# Patient Record
Sex: Male | Born: 1955 | Race: White | Hispanic: No | Marital: Married | Smoking: Former smoker
Health system: Southern US, Community
[De-identification: ages and names within clinical notes are randomized; demographics above are authoritative.]

## PROBLEM LIST (undated history)

## (undated) HISTORY — PX: CHOLECYSTECTOMY: SHX55

---

## 2001-01-15 ENCOUNTER — Ambulatory Visit: Admit: 2001-01-15 | Disposition: A | Discharge status: Home / Self Care | Payer: Self-pay

## 2002-04-12 ENCOUNTER — Inpatient Hospital Stay
Admission: EM | Admit: 2002-04-12 | Disposition: A | Discharge status: Home / Self Care | Payer: Self-pay | Source: Emergency Department | Admitting: Internal Medicine

## 2002-05-01 DIAGNOSIS — E0500 Thyrotoxicosis with diffuse goiter without thyrotoxic crisis or storm: Secondary | ICD-10-CM

## 2002-05-01 HISTORY — DX: Thyrotoxicosis with diffuse goiter without thyrotoxic crisis or storm: E05.00

## 2003-05-02 DIAGNOSIS — G8929 Other chronic pain: Secondary | ICD-10-CM

## 2003-05-02 HISTORY — DX: Other chronic pain: G89.29

## 2015-05-02 DIAGNOSIS — H9190 Unspecified hearing loss, unspecified ear: Secondary | ICD-10-CM

## 2015-05-02 HISTORY — DX: Unspecified hearing loss, unspecified ear: H91.90

## 2018-05-02 ENCOUNTER — Ambulatory Visit (INDEPENDENT_AMBULATORY_CARE_PROVIDER_SITE_OTHER): Payer: TRICARE Prime—HMO | Admitting: Family Medicine

## 2018-05-02 ENCOUNTER — Encounter (INDEPENDENT_AMBULATORY_CARE_PROVIDER_SITE_OTHER): Payer: Self-pay | Admitting: Family Medicine

## 2018-05-02 VITALS — BP 127/72 | HR 72 | Temp 97.5°F | Resp 12 | Ht 69.0 in | Wt 240.0 lb

## 2018-05-02 DIAGNOSIS — M25561 Pain in right knee: Secondary | ICD-10-CM

## 2018-05-02 DIAGNOSIS — M25562 Pain in left knee: Secondary | ICD-10-CM

## 2018-05-02 DIAGNOSIS — M5136 Other intervertebral disc degeneration, lumbar region: Secondary | ICD-10-CM

## 2018-05-02 DIAGNOSIS — L80 Vitiligo: Secondary | ICD-10-CM

## 2018-05-02 DIAGNOSIS — G8929 Other chronic pain: Secondary | ICD-10-CM

## 2018-05-02 NOTE — Progress Notes (Signed)
Subjective:      Date: 05/02/2018 11:20 PM   Patient ID: Justin Peterson is a 63 y.o. male.    Chief Complaint:  Chief Complaint   Patient presents with    Back Pain     x 55yrs due to accident in college. 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 Pt states [facility] Ulyess Blossom    Vitiligo     Derm referral to [facility]    Immunizations     pneumonia vaccine       HPI:  HPI     Patient presents new to the practice. Medical history significant for:  1. Chronic history of Low back pain: L5 and S1 herniated and desiccated disks.   2. Bilateral knee pain-  right knee worse than the left.   Pain has been a result of injuries during [employer] hockey and [employer].     He is followed by pain management (every 4 weeks), a chiropractor and routinely has massages by [facility].  The back pain can be severe with pain described as a 3/10 at this time but can be a 10/10. He  uses Vicodin 2-3 times per day, Robaxin 1- 2 times per day and  Naprosyn (for swelling) as needed/0    3. He has vitiligo - around the mouth, followed by Dermatology. Currently on desonide - 2 times per days alternated with Elidel. He needs a referral to Dermatology.         No problems updated.      Problem List:  There is no problem list on file for this patient.      Current Medications:  Current Outpatient Medications   Medication Sig Dispense Refill    aspirin EC 81 MG EC tablet Take 81 mg by mouth daily      atorvastatin (LIPITOR) 40 MG tablet Take 40 mg by mouth daily  1    Cholecalciferol (VITAMIN D) 50 MCG (2000 UT) Cap Take 1 tablet by mouth daily      desonide (DESOWEN) 0.05 % cream APPLY TO THE AFFECTED AREA ON FACE TWICE DAILY  0    famotidine (PEPCID) 20 MG tablet Take 20 mg by mouth nightly as needed  1    HYDROcodone-acetaminophen (NORCO) 5-325 MG per tablet Take 3 tablets by mouth daily  0    levothyroxine (SYNTHROID, LEVOTHROID) 175 MCG  tablet Take 175 mcg by mouth daily  1    lidocaine (LIDODERM) 5 % PLACE 2 PATCHES TO KNEE 12 HOURS ON 12 HOURS OFF  0    loratadine (CLARITIN) 10 MG tablet Take 10 mg by mouth daily  1    methocarbamol (ROBAXIN) 500 MG tablet Take 1 tablet by mouth 2 (two) times daily  0    montelukast (SINGULAIR) 10 MG tablet Take 10 mg by mouth every evening  1    naproxen (NAPROSYN) 500 MG tablet Take 1 tablet by mouth 2 (two) times daily  1    omeprazole (PRILOSEC) 40 MG capsule Take 40 mg by mouth daily  1    pimecrolimus (ELIDEL) 1 % cream APPLY TO THE AFFECTED AREA ON THE FACE TWICE DAILY  2    Tadalafil 2.5 MG Tab Take 1 tablet by mouth daily  1    tamsulosin (FLOMAX) 0.4 MG Cap Take 0.4 mg by mouth daily  1     No current facility-administered medications for this visit.  Allergies:  Allergies   Allergen Reactions    Methimazole Swelling     tapazole    Pennsaid [Diclofenac Sodium]      Rash burning sensation/ topical       Past Medical History:  Past Medical History:   Diagnosis Date    Chronic pain 2005    Graves disease 2004    Hearing loss 2017       Past Surgical History:  History reviewed. No pertinent surgical history.    Family History:  Family History   Problem Relation Age of Onset    Stroke Mother        Social History:  Social History     Socioeconomic History    Marital status: Married     Spouse name: Not on file    Number of children: Not on file    Years of education: Not on file    Highest education level: Not on file   Occupational History    Not on file   Social Needs    Financial resource strain: Not on file    Food insecurity:     Worry: Not on file     Inability: Not on file    Transportation needs:     Medical: Not on file     Non-medical: Not on file   Tobacco Use    Smoking status: Former Smoker    Smokeless tobacco: Never Used    Tobacco comment: quit 2013   Substance and Sexual Activity    Alcohol use: Not Currently     Comment: sober 2002    Drug use: Never     Sexual activity: Yes     Partners: Female   Lifestyle    Physical activity:     Days per week: Not on file     Minutes per session: Not on file    Stress: Not on file   Relationships    Social connections:     Talks on phone: Not on file     Gets together: Not on file     Attends religious service: Not on file     Active member of club or organization: Not on file     Attends meetings of clubs or organizations: Not on file     Relationship status: Not on file    Intimate partner violence:     Fear of current or ex partner: Not on file     Emotionally abused: Not on file     Physically abused: Not on file     Forced sexual activity: Not on file   Other Topics Concern    Not on file   Social History Narrative    Not on file       The following sections were reviewed this encounter by the provider:   Tobacco   Allergies   Meds   Problems   Med Hx   Surg Hx   Fam Hx          ROS:  Review of Systems   Respiratory: Negative for shortness of breath.    Cardiovascular: Negative for chest pain and palpitations.   Musculoskeletal: Positive for arthralgias (knees bilaterally) and back pain.   Skin: Positive for color change (perioral).   Psychiatric/Behavioral: Positive for sleep disturbance (pain makes sleep challenging).        Objective:     Vitals:  BP 127/72 (BP Site: Left arm, Patient Position: Sitting, Cuff Size: Medium)    Pulse  72    Temp 97.5 F (36.4 C) (Oral)    Resp 12    Ht 1.753 m (5\' 9" )    Wt 108.9 kg (240 lb)    SpO2 97%    BMI 35.44 kg/m     Physical Exam:  Physical Exam  Vitals signs and nursing note reviewed.   Constitutional:       Appearance: Normal appearance.   Cardiovascular:      Rate and Rhythm: Normal rate and regular rhythm.      Pulses: Normal pulses.   Musculoskeletal:      Comments: Pain with terminal ROM at the L-S spine - notably with forward flexion- but also with lateral rotation     Skin:     General: Skin is warm and dry.      Comments: Discoloration not clearly obvious at this  time   Neurological:      Mental Status: He is alert and oriented to person, place, and time.      Comments: negative straight leg raise. Symmetrically depressed patella reflexes   Psychiatric:         Mood and Affect: Mood normal.         Behavior: Behavior normal.         Assessment/Plan:       1. DDD (degenerative disc disease), lumbar  - Ambulatory referral to Pain Clinic    2. Chronic pain of both knees  - Ambulatory referral to Pain Clinic     - Pain management per Pain Clinic- Dr. Ulyess Blossom  3. Vitiligo  - Ambulatory referral to Dermatology   - Management per Dermatology      No follow-ups on file.    Recommend Shingrix vaccine- available at local pharmacies  Hep C testing pending  Follow up for persistent or worsening symptoms.        Philipp Ovens, MD

## 2018-05-02 NOTE — Progress Notes (Signed)
Have you seen any specialists/other providers since your last visit with us?    No    Arm preference verified?   Yes    The patient is due for shingles vaccine

## 2018-06-04 ENCOUNTER — Encounter (INDEPENDENT_AMBULATORY_CARE_PROVIDER_SITE_OTHER): Payer: Self-pay | Admitting: Family Medicine

## 2018-07-08 ENCOUNTER — Encounter (INDEPENDENT_AMBULATORY_CARE_PROVIDER_SITE_OTHER): Payer: Self-pay | Admitting: Family Medicine

## 2018-07-09 ENCOUNTER — Encounter (INDEPENDENT_AMBULATORY_CARE_PROVIDER_SITE_OTHER): Payer: Self-pay | Admitting: Family

## 2018-07-09 ENCOUNTER — Ambulatory Visit (INDEPENDENT_AMBULATORY_CARE_PROVIDER_SITE_OTHER): Payer: TRICARE Prime—HMO | Admitting: Family

## 2018-07-09 VITALS — BP 116/78 | HR 92 | Temp 97.5°F | Resp 18 | Ht 69.0 in | Wt 230.0 lb

## 2018-07-09 DIAGNOSIS — J0100 Acute maxillary sinusitis, unspecified: Secondary | ICD-10-CM

## 2018-07-09 NOTE — Progress Notes (Signed)
[facility] [place]    PROGRESS NOTE      Patient: Justin Peterson   Date: 07/09/2018   MRN: [id number]     Past Medical History:   Diagnosis Date    Chronic pain 2005    Graves disease 2004    Hearing loss 2017     Social History     Socioeconomic History    Marital status: Married     Spouse name: Not on file    Number of children: Not on file    Years of education: Not on file    Highest education level: Not on file   Occupational History    Not on file   Social Needs    Financial resource strain: Not on file    Food insecurity:     Worry: Not on file     Inability: Not on file    Transportation needs:     Medical: Not on file     Non-medical: Not on file   Tobacco Use    Smoking status: Former Smoker    Smokeless tobacco: Never Used    Tobacco comment: quit 2013   Substance and Sexual Activity    Alcohol use: Not Currently     Comment: sober 2002    Drug use: Never    Sexual activity: Yes     Partners: Female   Lifestyle    Physical activity:     Days per week: Not on file     Minutes per session: Not on file    Stress: Not on file   Relationships    Social connections:     Talks on phone: Not on file     Gets together: Not on file     Attends religious service: Not on file     Active member of club or organization: Not on file     Attends meetings of clubs or organizations: Not on file     Relationship status: Not on file    Intimate partner violence:     Fear of current or ex partner: Not on file     Emotionally abused: Not on file     Physically abused: Not on file     Forced sexual activity: Not on file   Other Topics Concern    Not on file   Social History Narrative    Not on file     Family History   Problem Relation Age of Onset    Stroke Mother            ASSESSMENT/PLAN     Justin Peterson is a 63 y.o. male    Chief Complaint   Patient presents with    Sinus Problem        1. Acute non-recurrent maxillary sinusitis    Most likely viral or related to allergies. Viral  infections can last 7-10 days so would recommend monitoring symptoms closely and f/u if persist or worsening but no indication for ABX today or additional testing needed.    There are many things that you can do though to help relieve your symptoms. Supportive care includes: REST, increase fluids   to help loosen secretions, Vitamin C 500-1000mg  daily, humdifer/hot showers, salt water gargles if sore throat presents, Tylenol & Ibuprofen OTC for pain relief/fever. Continue to eat a nutrition diet to aid in recovery. Universal precautions reviewed with pt to prevent spread.   If symptoms worsen or do not improve this would warrant further evaluation  and follow-up would be recommended.           Risk & Benefits of any new medication(s) if started at this visit, were explained to the patient (and family) who verbalized understanding & they are in agreement to the treatment plan.     Patient (family) encouraged to contact me/clinical staff with any questions/concerns    MEDICATIONS     Current Outpatient Medications   Medication Sig Dispense Refill    aspirin EC 81 MG EC tablet Take 81 mg by mouth daily      atorvastatin (LIPITOR) 40 MG tablet Take 40 mg by mouth daily  1    desonide (DESOWEN) 0.05 % cream APPLY TO THE AFFECTED AREA ON FACE TWICE DAILY  0    famotidine (PEPCID) 20 MG tablet Take 20 mg by mouth nightly as needed  1    HYDROcodone-acetaminophen (NORCO) 5-325 MG per tablet Take 3 tablets by mouth daily  0    levothyroxine (SYNTHROID, LEVOTHROID) 175 MCG tablet Take 175 mcg by mouth daily  1    lidocaine (LIDODERM) 5 % PLACE 2 PATCHES TO KNEE 12 HOURS ON 12 HOURS OFF  0    loratadine (CLARITIN) 10 MG tablet Take 10 mg by mouth daily  1    methocarbamol (ROBAXIN) 500 MG tablet Take 1 tablet by mouth as needed     0    montelukast (SINGULAIR) 10 MG tablet Take 10 mg by mouth every evening  1    naproxen (NAPROSYN) 500 MG tablet Take 1 tablet by mouth as needed     1    omeprazole (PRILOSEC) 40 MG  capsule Take 40 mg by mouth daily  1    pimecrolimus (ELIDEL) 1 % cream APPLY TO THE AFFECTED AREA ON THE FACE TWICE DAILY  2    sildenafil (VIAGRA) 100 MG tablet       tamsulosin (FLOMAX) 0.4 MG Cap Take 0.4 mg by mouth daily  1     No current facility-administered medications for this visit.        Allergies   Allergen Reactions    Methimazole Swelling     tapazole    Pennsaid [Diclofenac Sodium]      Rash burning sensation/ topical       SUBJECTIVE     Chief Complaint   Patient presents with    Sinus Problem      63 year old male with sinus problems. This started last Friday morning with congestion. Had just came back from a cruise on Saturday. Symptoms: stuffy nose, watery eyes, little ear ache in left ear, cough and sore throat. Not worried about strep and no fevers of flu-like symptoms    Sinusitis   This is a new problem. The current episode started in the past 7 days (4 days). The problem is unchanged. There has been no fever. He is experiencing no pain (pressure only). Associated symptoms include congestion, coughing and sinus pressure. Pertinent negatives include no chills, ear pain, headaches, hoarse voice, shortness of breath, sneezing, sore throat or swollen glands. Treatments tried: zrytec- zicam. The treatment provided no relief.       ROS     Review of Systems   Constitutional: Negative for activity change, appetite change, chills, fatigue and fever.   HENT: Positive for congestion and sinus pressure. Negative for ear pain, hoarse voice, postnasal drip, rhinorrhea, sneezing, sore throat and voice change.    Eyes: Negative for discharge.   Respiratory: Positive for cough.  Negative for shortness of breath and wheezing.    Cardiovascular: Negative for chest pain and palpitations.   Gastrointestinal: Negative for abdominal pain, constipation, diarrhea, nausea and vomiting.   Musculoskeletal: Negative for myalgias.   Skin: Negative for rash.   Neurological: Negative for dizziness and headaches.        The following portions of the patient's history were reviewed and updated as appropriate: Allergies, Current Medications, Past Family History, Past Medical history, Past social history, Past surgical history, and Problem List.      PHYSICAL EXAM       Vitals:    07/09/18 1316   BP: 116/78   BP Site: Left arm   Patient Position: Sitting   Cuff Size: Medium   Pulse: 92   Resp: 18   Temp: 97.5 F (36.4 C)   TempSrc: Oral   SpO2: 98%   Weight: 104.3 kg (230 lb)   Height: 1.753 m (5\' 9" )       Physical Exam   Nursing note and vitals reviewed.  Constitutional: He is oriented to person, place, and time. He appears well-developed and well-nourished. No distress.   HENT:   Head: Normocephalic.   Right Ear: Tympanic membrane and external ear normal.   Left Ear: Tympanic membrane and external ear normal.   Nose: Nose normal. No mucosal edema. Right sinus exhibits no maxillary sinus tenderness and no frontal sinus tenderness. Left sinus exhibits no maxillary sinus tenderness and no frontal sinus tenderness.   Mouth/Throat: Uvula is midline, oropharynx is clear and moist and mucous membranes are normal. No oropharyngeal exudate, posterior oropharyngeal edema or posterior oropharyngeal erythema.   Eyes: Conjunctivae are normal.   Cardiovascular: Normal rate, regular rhythm and normal heart sounds. Exam reveals no gallop and no friction rub.   No murmur heard.  Pulmonary/Chest: Effort normal and breath sounds normal. No respiratory distress. He has no wheezes. He has no rales.   Lymphadenopathy:     He has no cervical adenopathy.   Neurological: He is alert and oriented to person, place, and time.   Skin: Skin is warm and dry. No rash noted.   Psychiatric: He has a normal mood and affect.       No results found for this or any previous visit.    Signed,  Balinda Quails, FNP-c  07/09/2018

## 2018-07-09 NOTE — Patient Instructions (Signed)
Sinusitis (No Antibiotics)    The sinuses are air-filled spaces within the bones of the face. They connect to the inside of the nose.Sinusitisis an inflammation of the tissue that lines the sinuses. Sinusitiscan occur during a cold.It can also happen due to allergies to pollens and other particles in the air.It can cause symptoms such as sinus congestion,headache, sore throat, facial swelling,and a feeling of fullness. It may also cause a low-grade fever.Your sinusitis does not include an infection with bacteria. Because of this, antibiotics are not used to treat this problem.  Home care   Drink plenty of water, hot tea, and other liquids. This may help thin nasal mucus. It also may help your sinuses drain fluids.   Heat may help soothe painful areas of your face. Use a towel soaked in hot water. Or, stand in the shower and direct the warm spray onto your face. Using a vaporizer along with a menthol rub at night may also help soothe symptoms.   Anexpectorantwith guaifenesin may help thin nasal mucus and help your sinuses drain fluids.   You can use an over-the-counterdecongestant,unless a similar medicine was prescribed to you. Nasal sprays work the fastest. Use one that contains phenylephrine or oxymetazoline. First blow your nose gently. Then use the spray. Do not use these medicines more often than directed on the label. If you do, your symptoms may get worse. You may also take pills that contain pseudoephedrine. Don't use products that combine multiple medicines. This is because side effects may be increased. Read all medicine labels. You can also ask the pharmacist for help. (People with high blood pressure should not use decongestants. They can raise blood pressure.)   Over-the-counterantihistaminesmay help if allergies contributed to your sinusitis.    Use acetaminophen or ibuprofen to control pain, unless another pain medicine was prescribed to you. If you have chronic liver or kidney  disease or ever had a stomach ulcer, talk with your healthcare provider before using these medicines. (Aspirin should never be taken by anyone under age 18 who is ill with a fever. It may cause severe liver damage.)   Use nasal rinses or irrigation as instructed by your healthcare provider.   Don't smoke. This can make symptoms worse.    Follow-up care  Follow up with your healthcare provider or our staff if you are not better in 1 week.  When to seek medical advice  Call your healthcare provider if any of these occur:   Green or yellow fluid draining from your nose or into your throat   Facial pain or headache that gets worse   Stiff neck   Unusual drowsiness or confusion   Swelling of your forehead or eyelids   Vision problems, such as blurred or double vision   Fever of100.4F (38C)or higher, or as directed by your healthcare provider   Seizure   Breathing problems   Symptoms that don't go away in 10 days  [employer] last reviewed this educational content on 03/01/2016   2000-2020 [employer] [address], [place] [address]. All rights reserved. This information is not intended as a substitute for professional medical care. Always follow your healthcare professional's instructions.

## 2018-07-09 NOTE — Progress Notes (Signed)
Have you seen any specialists/other providers since your last visit with us?    Yes    Arm preference verified?   Yes    The patient is due for shingles vaccine and Hep C Screening

## 2018-08-16 ENCOUNTER — Encounter (INDEPENDENT_AMBULATORY_CARE_PROVIDER_SITE_OTHER): Payer: Self-pay | Admitting: Family Medicine

## 2018-09-02 ENCOUNTER — Encounter (INDEPENDENT_AMBULATORY_CARE_PROVIDER_SITE_OTHER): Payer: Self-pay | Admitting: Family

## 2018-09-02 ENCOUNTER — Telehealth: Payer: TRICARE Prime—HMO | Admitting: Family

## 2018-09-02 DIAGNOSIS — R3911 Hesitancy of micturition: Secondary | ICD-10-CM | POA: Insufficient documentation

## 2018-09-02 DIAGNOSIS — Z7689 Persons encountering health services in other specified circumstances: Secondary | ICD-10-CM

## 2018-09-02 DIAGNOSIS — G8929 Other chronic pain: Secondary | ICD-10-CM | POA: Insufficient documentation

## 2018-09-02 DIAGNOSIS — K2270 Barrett's esophagus without dysplasia: Secondary | ICD-10-CM | POA: Insufficient documentation

## 2018-09-02 DIAGNOSIS — Z9109 Other allergy status, other than to drugs and biological substances: Secondary | ICD-10-CM

## 2018-09-02 DIAGNOSIS — N401 Enlarged prostate with lower urinary tract symptoms: Secondary | ICD-10-CM

## 2018-09-02 DIAGNOSIS — K219 Gastro-esophageal reflux disease without esophagitis: Secondary | ICD-10-CM

## 2018-09-02 DIAGNOSIS — N529 Male erectile dysfunction, unspecified: Secondary | ICD-10-CM

## 2018-09-02 DIAGNOSIS — L80 Vitiligo: Secondary | ICD-10-CM

## 2018-09-02 DIAGNOSIS — N528 Other male erectile dysfunction: Secondary | ICD-10-CM | POA: Insufficient documentation

## 2018-09-02 DIAGNOSIS — E0500 Thyrotoxicosis with diffuse goiter without thyrotoxic crisis or storm: Secondary | ICD-10-CM

## 2018-09-02 DIAGNOSIS — E782 Mixed hyperlipidemia: Secondary | ICD-10-CM

## 2018-09-02 MED ORDER — SILDENAFIL CITRATE 100 MG PO TABS
100.0000 mg | ORAL_TABLET | ORAL | 2 refills | Status: DC | PRN
Start: 2018-09-02 — End: 2019-02-06

## 2018-09-02 MED ORDER — OMEPRAZOLE 40 MG PO CPDR
40.00 mg | DELAYED_RELEASE_CAPSULE | Freq: Every day | ORAL | 1 refills | Status: DC
Start: 2018-09-02 — End: 2019-02-20

## 2018-09-02 MED ORDER — MONTELUKAST SODIUM 10 MG PO TABS
10.0000 mg | ORAL_TABLET | Freq: Every evening | ORAL | 3 refills | Status: DC
Start: 2018-09-02 — End: 2019-08-07

## 2018-09-02 MED ORDER — ATORVASTATIN CALCIUM 40 MG PO TABS
40.0000 mg | ORAL_TABLET | Freq: Every day | ORAL | 1 refills | Status: DC
Start: 2018-09-02 — End: 2019-01-20

## 2018-09-02 MED ORDER — FAMOTIDINE 20 MG PO TABS
20.0000 mg | ORAL_TABLET | Freq: Every evening | ORAL | 1 refills | Status: DC | PRN
Start: 2018-09-02 — End: 2019-08-07

## 2018-09-02 MED ORDER — LEVOTHYROXINE SODIUM 175 MCG PO TABS
175.0000 ug | ORAL_TABLET | Freq: Every day | ORAL | 1 refills | Status: DC
Start: 2018-09-02 — End: 2019-01-20

## 2018-09-02 MED ORDER — TAMSULOSIN HCL 0.4 MG PO CAPS
0.40 mg | ORAL_CAPSULE | Freq: Every day | ORAL | 3 refills | Status: DC
Start: 2018-09-02 — End: 2019-08-07

## 2018-09-02 MED ORDER — LORATADINE 10 MG PO TABS
10.0000 mg | ORAL_TABLET | Freq: Every day | ORAL | 3 refills | Status: DC
Start: 2018-09-02 — End: 2019-08-07

## 2018-09-02 NOTE — Progress Notes (Signed)
[facility]    PROGRESS NOTE      Patient: Justin Peterson   Date: 09/02/2018   MRN: [id number]     Past Medical History:   Diagnosis Date    Chronic pain 2005    Graves disease 2004    Hearing loss 2017     Social History     Socioeconomic History    Marital status: Married     Spouse name: Not on file    Number of children: Not on file    Years of education: Not on file    Highest education level: Not on file   Occupational History    Not on file   Social Needs    Financial resource strain: Not on file    Food insecurity:     Worry: Not on file     Inability: Not on file    Transportation needs:     Medical: Not on file     Non-medical: Not on file   Tobacco Use    Smoking status: Former Smoker    Smokeless tobacco: Never Used    Tobacco comment: quit 2013   Substance and Sexual Activity    Alcohol use: Not Currently     Comment: sober 2002    Drug use: Never    Sexual activity: Yes     Partners: Female   Lifestyle    Physical activity:     Days per week: Not on file     Minutes per session: Not on file    Stress: Not on file   Relationships    Social connections:     Talks on phone: Not on file     Gets together: Not on file     Attends religious service: Not on file     Active member of club or organization: Not on file     Attends meetings of clubs or organizations: Not on file     Relationship status: Not on file    Intimate partner violence:     Fear of current or ex partner: Not on file     Emotionally abused: Not on file     Physically abused: Not on file     Forced sexual activity: Not on file   Other Topics Concern    Not on file   Social History Narrative    Not on file     Family History   Problem Relation Age of Onset    Stroke Mother            ASSESSMENT/PLAN     Justin Peterson is a 63 y.o. male    Chief Complaint   Patient presents with    Hyperlipidemia     Follow up and med refill. Doing fine on all medications. Needs them all refilled.     Hypothyroidism      Follow up. Doing well on medication. Needs a refill.     Medication Refill     Needs refills on all meds.         1. Mixed hyperlipidemia  - atorvastatin (LIPITOR) 40 MG tablet; Take 1 tablet (40 mg total) by mouth daily  Dispense: 90 tablet; Refill: 1    Chronic, stable; continue current meds  Declined labs due to covid pandemic risk- last labs with previous PCP 6 months ago and will get records.    2. Graves' disease  - levothyroxine (SYNTHROID, LEVOTHROID) 175 MCG tablet; Take 1 tablet (175 mcg total)  by mouth daily  Dispense: 90 tablet; Refill: 1    Chronic, stable; continue current meds  Declined labs due to covid pandemic risk- last labs with previous PCP 6 months ago and will get records.    3. Other chronic pain    Continue to follow with pain management    4. Gastroesophageal reflux disease without esophagitis  - Ambulatory referral to Gastroenterology  - famotidine (PEPCID) 20 MG tablet; Take 1 tablet (20 mg total) by mouth nightly as needed for Heartburn  Dispense: 90 tablet; Refill: 1  - omeprazole (PRILOSEC) 40 MG capsule; Take 1 capsule (40 mg total) by mouth daily  Dispense: 90 capsule; Refill: 1    Chronic, likely naproxen use contributing discuss with pain management.   F/u with GI due to chronic use    5. Vitiligo    Continue to follow with dermatologist    6. Environmental allergies  - montelukast (SINGULAIR) 10 MG tablet; Take 1 tablet (10 mg total) by mouth every evening  Dispense: 90 tablet; Refill: 3  - loratadine (CLARITIN) 10 MG tablet; Take 1 tablet (10 mg total) by mouth daily  Dispense: 90 tablet; Refill: 3    Chronic, stable; continue current meds    7. Benign prostatic hyperplasia with urinary hesitancy  - tamsulosin (FLOMAX) 0.4 MG Cap; Take 1 capsule (0.4 mg total) by mouth daily  Dispense: 90 capsule; Refill: 3    Chronic, stable; continue current meds  Doe not feel he needs urologist  PSA due in 6 months    8. Erectile dysfunction, unspecified erectile dysfunction type  -  sildenafil (VIAGRA) 100 MG tablet; Take 1 tablet (100 mg total) by mouth as needed for Erectile Dysfunction  Dispense: 18 tablet; Refill: 2    9. Encounter to establish care    Schedule physical 6 months         Risk & Benefits of the new medication(s) were explained to the patient (and family) who verbalized understanding & agreed to the treatment plan. Patient (family) encouraged to contact me/clinical staff with any questions/concerns      MEDICATIONS     Current Outpatient Medications   Medication Sig Dispense Refill    aspirin EC 81 MG EC tablet Take 81 mg by mouth daily      atorvastatin (LIPITOR) 40 MG tablet Take 1 tablet (40 mg total) by mouth daily 90 tablet 1    desonide (DESOWEN) 0.05 % cream APPLY TO THE AFFECTED AREA ON FACE TWICE DAILY  0    famotidine (PEPCID) 20 MG tablet Take 1 tablet (20 mg total) by mouth nightly as needed for Heartburn 90 tablet 1    HYDROcodone-acetaminophen (NORCO) 5-325 MG per tablet Take 3 tablets by mouth daily  0    levothyroxine (SYNTHROID, LEVOTHROID) 175 MCG tablet Take 1 tablet (175 mcg total) by mouth daily 90 tablet 1    lidocaine (LIDODERM) 5 % as needed     0    loratadine (CLARITIN) 10 MG tablet Take 1 tablet (10 mg total) by mouth daily 90 tablet 3    methocarbamol (ROBAXIN) 500 MG tablet Take 1 tablet by mouth as needed     0    montelukast (SINGULAIR) 10 MG tablet Take 1 tablet (10 mg total) by mouth every evening 90 tablet 3    naproxen (NAPROSYN) 500 MG tablet Take 1 tablet by mouth as needed     1    omeprazole (PRILOSEC) 40 MG capsule Take 1 capsule (40 mg  total) by mouth daily 90 capsule 1    pimecrolimus (ELIDEL) 1 % cream APPLY TO THE AFFECTED AREA ON THE FACE TWICE DAILY  2    sildenafil (VIAGRA) 100 MG tablet Take 1 tablet (100 mg total) by mouth as needed for Erectile Dysfunction 18 tablet 2    tamsulosin (FLOMAX) 0.4 MG Cap Take 1 capsule (0.4 mg total) by mouth daily 90 capsule 3     No current facility-administered medications for  this visit.        Allergies   Allergen Reactions    Methimazole Swelling     tapazole    Pennsaid [Diclofenac Sodium]      Rash burning sensation/ topical       SUBJECTIVE     Chief Complaint   Patient presents with    Hyperlipidemia     Follow up and med refill. Doing fine on all medications. Needs them all refilled.     Hypothyroidism     Follow up. Doing well on medication. Needs a refill.     Medication Refill     Needs refills on all meds.         HPI    Verbal consent has been obtained from the patient to conduct a video and telephone visit to minimize exposure to COVID-48: yes    63 year old male present via video to establish care and all conditions new to me and needs med refill.   .  Problem   Graves' Disease    Chronic and dx 2004/2005- US done and showed graves. Symptoms at dx: fatigue, increase hunger, itching all over, and weight loss.  Did follow with endocrinologist at dx- had radioactive iodine. Has been on synthroid and stable since and no need for dose changes in awhile. No current symptoms. Last labs 6 months ago and normal per pt.     Mixed Hyperlipidemia    Chronic and has been controlled per pt on lipitor 40 mg and no SE to medication. Pt reports last labs was 6 months ago and cholesterol normal as well as liver function.     Other Chronic Pain    [facility] and on Norco three times a day and robaxin. Also taking naproxen daily and sometimes twice a day and will discuss with spine specialist.      Gastroesophageal Reflux Disease Without Esophagitis    Chronic and on pepcid at night and Prilosec in morning. Has not seen GI specialist.      Vitiligo    Follows with dermatologist and stable on meds     Benign Prostatic Hyperplasia With Urinary Hesitancy    PSA last year was normal and likely BPH. Symptoms: hesitation at times. Has never followed urologist since starting flomax for over 5 year. Also takes chronic hydrocodone.      Other Male Erectile  Dysfunction    Has been on viagra and cialsis both and tolerated well and insurance covers viagra.         ROS     General/Constitutional:   Denies Chills. Denies Fatigue. Denies Fever.   Ophthalmologic:   Denies Blurred vision.   ENT:   Denies Nasal Discharge. Denies Sinus pain. Denies Sore throat.   Respiratory:   Denies Cough. Denies Shortness of breath. Denies Wheezing.   Cardiovascular:   Denies Chest pain. Denies Chest pain with exertion. Denies Palpitations. Denies  Swelling in hands/feet.   Gastrointestinal:   Denies Abdominal pain. Denies Constipation. Denies  Diarrhea. Denies Nausea. Denies  Vomiting.   Skin:   Denies Rash.   Neurologic:   Denies Dizziness. Denies Headache. Denies Tingling/Numbness.       The following portions of the patient's history were reviewed and updated as appropriate: Allergies, Current Medications, Past Family History, Past Medical history, Past social history, Past surgical history, and Problem List.      PHYSICAL EXAM       There were no vitals filed for this visit.    General Examination:   GENERAL APPEARANCE: alert, in no acute distress, well developed, well nourished,  oriented to time, place, and person.   LUNGS: normal effort / no distress  EXTREMITIES: no edema bilaterally.   NEUROLOGIC: alert and oriented.         No results found for this or any previous visit.    Signed,  Balinda Quails, FNP-c  09/02/2018

## 2018-09-02 NOTE — Progress Notes (Signed)
Have you seen any specialists/other providers since your last visit with us?    Yes    Arm preference verified?   No    The patient is due for shingles vaccine and Hep C Screening

## 2018-11-13 ENCOUNTER — Encounter (INDEPENDENT_AMBULATORY_CARE_PROVIDER_SITE_OTHER): Payer: Self-pay | Admitting: Family

## 2018-12-04 ENCOUNTER — Encounter (INDEPENDENT_AMBULATORY_CARE_PROVIDER_SITE_OTHER): Payer: Self-pay | Admitting: Family

## 2018-12-05 ENCOUNTER — Other Ambulatory Visit (INDEPENDENT_AMBULATORY_CARE_PROVIDER_SITE_OTHER): Payer: Self-pay | Admitting: Family

## 2018-12-05 DIAGNOSIS — H538 Other visual disturbances: Secondary | ICD-10-CM

## 2018-12-05 NOTE — Progress Notes (Signed)
Ref for Opth. Queued up

## 2018-12-06 ENCOUNTER — Encounter (INDEPENDENT_AMBULATORY_CARE_PROVIDER_SITE_OTHER): Payer: Self-pay | Admitting: Family

## 2018-12-06 ENCOUNTER — Encounter (INDEPENDENT_AMBULATORY_CARE_PROVIDER_SITE_OTHER): Payer: Self-pay

## 2018-12-31 ENCOUNTER — Encounter (INDEPENDENT_AMBULATORY_CARE_PROVIDER_SITE_OTHER): Payer: Self-pay | Admitting: Family

## 2019-01-20 ENCOUNTER — Other Ambulatory Visit (INDEPENDENT_AMBULATORY_CARE_PROVIDER_SITE_OTHER): Payer: Self-pay | Admitting: Family

## 2019-01-20 DIAGNOSIS — E0500 Thyrotoxicosis with diffuse goiter without thyrotoxic crisis or storm: Secondary | ICD-10-CM

## 2019-01-20 DIAGNOSIS — E782 Mixed hyperlipidemia: Secondary | ICD-10-CM

## 2019-01-20 NOTE — Telephone Encounter (Signed)
Will refill for 90 day supply but recommend appt prior to next refills so we can get labs up to date and f/u- can you help him schedule an appt

## 2019-01-21 NOTE — Telephone Encounter (Signed)
Has an appointment on 02/06/2019

## 2019-01-24 ENCOUNTER — Encounter (INDEPENDENT_AMBULATORY_CARE_PROVIDER_SITE_OTHER): Payer: Self-pay | Admitting: Family

## 2019-02-06 ENCOUNTER — Ambulatory Visit (INDEPENDENT_AMBULATORY_CARE_PROVIDER_SITE_OTHER): Payer: TRICARE Prime—HMO | Admitting: Family

## 2019-02-06 ENCOUNTER — Encounter (INDEPENDENT_AMBULATORY_CARE_PROVIDER_SITE_OTHER): Payer: Self-pay | Admitting: Family

## 2019-02-06 VITALS — BP 106/67 | HR 76 | Temp 98.0°F | Resp 18 | Ht 69.0 in | Wt 240.0 lb

## 2019-02-06 DIAGNOSIS — N529 Male erectile dysfunction, unspecified: Secondary | ICD-10-CM

## 2019-02-06 DIAGNOSIS — E0500 Thyrotoxicosis with diffuse goiter without thyrotoxic crisis or storm: Secondary | ICD-10-CM

## 2019-02-06 DIAGNOSIS — G8929 Other chronic pain: Secondary | ICD-10-CM

## 2019-02-06 DIAGNOSIS — Z23 Encounter for immunization: Secondary | ICD-10-CM

## 2019-02-06 DIAGNOSIS — E669 Obesity, unspecified: Secondary | ICD-10-CM

## 2019-02-06 DIAGNOSIS — R3911 Hesitancy of micturition: Secondary | ICD-10-CM

## 2019-02-06 DIAGNOSIS — Z0000 Encounter for general adult medical examination without abnormal findings: Secondary | ICD-10-CM

## 2019-02-06 DIAGNOSIS — K219 Gastro-esophageal reflux disease without esophagitis: Secondary | ICD-10-CM

## 2019-02-06 DIAGNOSIS — Z1283 Encounter for screening for malignant neoplasm of skin: Secondary | ICD-10-CM

## 2019-02-06 DIAGNOSIS — M545 Low back pain: Secondary | ICD-10-CM

## 2019-02-06 DIAGNOSIS — E782 Mixed hyperlipidemia: Secondary | ICD-10-CM

## 2019-02-06 DIAGNOSIS — N401 Enlarged prostate with lower urinary tract symptoms: Secondary | ICD-10-CM

## 2019-02-06 DIAGNOSIS — Z125 Encounter for screening for malignant neoplasm of prostate: Secondary | ICD-10-CM

## 2019-02-06 DIAGNOSIS — Z1159 Encounter for screening for other viral diseases: Secondary | ICD-10-CM

## 2019-02-06 DIAGNOSIS — L80 Vitiligo: Secondary | ICD-10-CM

## 2019-02-06 LAB — COMPREHENSIVE METABOLIC PANEL
ALT: 27 U/L (ref 0–55)
AST (SGOT): 23 U/L (ref 5–34)
Albumin/Globulin Ratio: 1.9 (ref 0.9–2.2)
Albumin: 4.8 g/dL (ref 3.5–5.0)
Alkaline Phosphatase: 94 U/L (ref 38–106)
Anion Gap: 12 (ref 5.0–15.0)
BUN: 12 mg/dL (ref 9.0–28.0)
Bilirubin, Total: 0.6 mg/dL (ref 0.2–1.2)
CO2: 24 mEq/L (ref 21–29)
Calcium: 9.9 mg/dL (ref 8.5–10.5)
Chloride: 106 mEq/L (ref 100–111)
Creatinine: 0.9 mg/dL (ref 0.5–1.5)
Globulin: 2.5 g/dL (ref 2.0–3.7)
Glucose: 98 mg/dL (ref 70–100)
Potassium: 4.2 mEq/L (ref 3.5–5.1)
Protein, Total: 7.3 g/dL (ref 6.0–8.3)
Sodium: 142 mEq/L (ref 136–145)

## 2019-02-06 LAB — CBC AND DIFFERENTIAL
Absolute NRBC: 0 10*3/uL (ref 0.00–0.00)
Basophils Absolute Automated: 0.07 10*3/uL (ref 0.00–0.08)
Basophils Automated: 1.1 %
Eosinophils Absolute Automated: 0.36 10*3/uL (ref 0.00–0.44)
Eosinophils Automated: 5.9 %
Hematocrit: 42.4 % (ref 37.6–49.6)
Hgb: 14.3 g/dL (ref 12.5–17.1)
Immature Granulocytes Absolute: 0.02 10*3/uL (ref 0.00–0.07)
Immature Granulocytes: 0.3 %
Lymphocytes Absolute Automated: 1.2 10*3/uL (ref 0.42–3.22)
Lymphocytes Automated: 19.7 %
MCH: 30.6 pg (ref 25.1–33.5)
MCHC: 33.7 g/dL (ref 31.5–35.8)
MCV: 90.8 fL (ref 78.0–96.0)
MPV: 9.6 fL (ref 8.9–12.5)
Monocytes Absolute Automated: 0.51 10*3/uL (ref 0.21–0.85)
Monocytes: 8.4 %
Neutrophils Absolute: 3.94 10*3/uL (ref 1.10–6.33)
Neutrophils: 64.6 %
Nucleated RBC: 0 /100 WBC (ref 0.0–0.0)
Platelets: 281 10*3/uL (ref 142–346)
RBC: 4.67 10*6/uL (ref 4.20–5.90)
RDW: 13 % (ref 11–15)
WBC: 6.1 10*3/uL (ref 3.10–9.50)

## 2019-02-06 LAB — LIPID PANEL
Cholesterol / HDL Ratio: 3.2
Cholesterol: 132 mg/dL (ref 0–199)
HDL: 41 mg/dL (ref 40–9999)
LDL Calculated: 77 mg/dL (ref 0–99)
Triglycerides: 71 mg/dL (ref 34–149)
VLDL Calculated: 14 mg/dL (ref 10–40)

## 2019-02-06 LAB — TSH: TSH: 0.04 u[IU]/mL — ABNORMAL LOW (ref 0.35–4.94)

## 2019-02-06 LAB — HEMOLYSIS INDEX: Hemolysis Index: 43 — ABNORMAL HIGH (ref 0–18)

## 2019-02-06 LAB — URINALYSIS
Bilirubin, UA: NEGATIVE
Blood, UA: NEGATIVE
Glucose, UA: NEGATIVE
Ketones UA: NEGATIVE
Leukocyte Esterase, UA: NEGATIVE
Nitrite, UA: NEGATIVE
Protein, UR: NEGATIVE
Specific Gravity UA: 1.007 (ref 1.001–1.035)
Urine pH: 6.5 (ref 5.0–8.0)
Urobilinogen, UA: 0.2 (ref 0.2–2.0)

## 2019-02-06 LAB — VITAMIN D,25 OH,TOTAL: Vitamin D, 25 OH, Total: 56 ng/mL (ref 30–100)

## 2019-02-06 LAB — HEMOGLOBIN A1C
Average Estimated Glucose: 108.3 mg/dL
Hemoglobin A1C: 5.4 % (ref 4.6–5.9)

## 2019-02-06 LAB — HEPATITIS C ANTIBODY: Hepatitis C, AB: NONREACTIVE

## 2019-02-06 LAB — GFR: EGFR: >60

## 2019-02-06 LAB — PSA: Prostate Specific Antigen, Total: 1.065 ng/mL (ref 0.000–4.000)

## 2019-02-06 LAB — T4, FREE: T4 Free: 1.5 ng/dL — ABNORMAL HIGH (ref 0.70–1.48)

## 2019-02-06 MED ORDER — SILDENAFIL CITRATE 100 MG PO TABS
100.0000 mg | ORAL_TABLET | ORAL | 2 refills | Status: DC | PRN
Start: 2019-02-06 — End: 2019-08-07

## 2019-02-06 MED ORDER — ATORVASTATIN CALCIUM 40 MG PO TABS
40.0000 mg | ORAL_TABLET | Freq: Every day | ORAL | 1 refills | Status: DC
Start: 2019-02-06 — End: 2019-08-07

## 2019-02-06 MED ORDER — LEVOTHYROXINE SODIUM 175 MCG PO TABS
175.0000 ug | ORAL_TABLET | Freq: Every day | ORAL | 1 refills | Status: DC
Start: 2019-02-06 — End: 2019-05-21

## 2019-02-06 NOTE — Progress Notes (Signed)
[facility]    PROGRESS NOTE      Patient: Justin Peterson   Date: 02/06/2019   MRN: [id number]     Past Medical History:   Diagnosis Date    Chronic pain 2005    Graves disease 2004    Hearing loss 2017     Social History     Socioeconomic History    Marital status: Married     Spouse name: Not on file    Number of children: Not on file    Years of education: Not on file    Highest education level: Not on file   Occupational History    Not on file   Social Needs    Financial resource strain: Not on file    Food insecurity     Worry: Not on file     Inability: Not on file    Transportation needs     Medical: Not on file     Non-medical: Not on file   Tobacco Use    Smoking status: Former Smoker    Smokeless tobacco: Never Used    Tobacco comment: quit 2013   Substance and Sexual Activity    Alcohol use: Not Currently     Comment: sober 2002    Drug use: Never    Sexual activity: Yes     Partners: Female   Lifestyle    Physical activity     Days per week: Not on file     Minutes per session: Not on file    Stress: Not on file   Relationships    Social connections     Talks on phone: Not on file     Gets together: Not on file     Attends religious service: Not on file     Active member of club or organization: Not on file     Attends meetings of clubs or organizations: Not on file     Relationship status: Not on file    Intimate partner violence     Fear of current or ex partner: Not on file     Emotionally abused: Not on file     Physically abused: Not on file     Forced sexual activity: Not on file   Other Topics Concern    Not on file   Social History Narrative    Not on file     Family History   Problem Relation Age of Onset    Stroke Mother            ASSESSMENT/PLAN     Kelden Lavallee is a 63 y.o. male    Chief Complaint   Patient presents with    Annual Exam     He is fasting. Needs to renew pain clinic, dermatologist referrals.         1. Annual physical exam  - CBC and  differential  - Comprehensive metabolic panel  - TSH  - Lipid panel  - Urinalysis  - Vitamin D,25 OH, Total    Health promotion and disease prevention/screening recommendations discussed.    Exercise: Optimize aerobic exercise per ACC guidelines of 150 min per week.     Healthy eating: Aim for (8) 8 oz glasses  of water per day. Dietary fiber 25 grams per day. Optimize weight by eating a well balanced diet and encouraged daily calcium and vitamin D. Heart healthy diet encouraged, such as limiting saturated fat and trans fat and replacing these fats  with the poly and mono unsaturated fats. Plant sterols, fish oil, fiber, and sodium reduction is ideal.     Sleep hygiene: Recommendations for optimizing sleep hygiene includes: counseled on sleep hygiene (getting 6 hrs sleep minimum) 1) regular sleep times 2) Avoid caffeine 4-6 hours prior to going to bed 3) Avoid alcohol for at least 4-6 hours before going to bed 4) Engage in regular exercise 5) Keep daytime routine activities the same as planned 6) Stop screen time/viewing 1 hours prior to bedtime and 7) eat a well-balance diet, preferrably eat dinner no later than 2-3 hours prior to going to sleep.Reviewed labs with patient. Patient agreeable to plan.     Safety: seat belts, no texting and driving, no drinking and driving, no riding with people who have been drinking, moderation (calories, caffeine, alcohol etc), domestic violence awareness, recommend annual flu shots.    2. Need for influenza vaccination  - Flu vacc quad recombinant pres free 18 yrs& up    3. Mixed hyperlipidemia  - Lipid panel  - atorvastatin (LIPITOR) 40 MG tablet; Take 1 tablet (40 mg total) by mouth daily  Dispense: 90 tablet; Refill: 1    4. Graves' disease  - TSH  - T3, free  - T4, free  - levothyroxine (SYNTHROID) 175 MCG tablet; Take 1 tablet (175 mcg total) by mouth daily  Dispense: 90 tablet; Refill: 1    5. Gastroesophageal reflux disease without esophagitis    6. Benign prostatic  hyperplasia with urinary hesitancy  - PSA    7. Screening for prostate cancer  - PSA    8. Erectile dysfunction, unspecified erectile dysfunction type  - sildenafil (VIAGRA) 100 MG tablet; Take 1 tablet (100 mg total) by mouth as needed for Erectile Dysfunction  Dispense: 18 tablet; Refill: 2    9. Obesity (BMI 35.0-39.9 without comorbidity)  - Hemoglobin A1C    10. Chronic bilateral low back pain, unspecified whether sciatica present  - Ambulatory referral to Pain Clinic    11. Skin cancer screening  - Ambulatory referral to Dermatology    12. Vitiligo  - Ambulatory referral to Dermatology    13. Need for tetanus booster  - Td vaccine greater than or equal to 7yo IM    14. Need for shingles vaccine  - Zoster Vaccine Recomb,Adjuvanted (IM)    15. Encounter for hepatitis C screening test for low risk patient  - Hepatitis C (HCV) antibody, Total         Health maintenance:  -Fasting blood work: today, reviewed planned labs and pt agrees to them.   -Immunizations: flu, shingles, td today  -STD screen: declined need as no concerns  -Colonoscopy: due at 75  -Vision: UTD  -Dental: UTD      Risk & Benefits of the new medication(s) were explained to the patient (and family) who verbalized understanding & agreed to the treatment plan. Patient (family) encouraged to contact me/clinical staff with any questions/concerns      MEDICATIONS     Current Outpatient Medications   Medication Sig Dispense Refill    aspirin EC 81 MG EC tablet Take 81 mg by mouth daily      atorvastatin (LIPITOR) 40 MG tablet Take 1 tablet (40 mg total) by mouth daily 90 tablet 1    desonide (DESOWEN) 0.05 % cream APPLY TO THE AFFECTED AREA ON FACE TWICE DAILY  0    famotidine (PEPCID) 20 MG tablet Take 1 tablet (20 mg total) by mouth nightly as needed  for Heartburn (Patient taking differently: Take 40 mg by mouth nightly as needed for Heartburn   ) 90 tablet 1    Glucosamine 500 MG Cap Take by mouth 3 (three) times daily      levothyroxine  (SYNTHROID) 175 MCG tablet Take 1 tablet (175 mcg total) by mouth daily 90 tablet 1    lidocaine (LIDODERM) 5 % as needed     0    loratadine (CLARITIN) 10 MG tablet Take 1 tablet (10 mg total) by mouth daily 90 tablet 3    methocarbamol (ROBAXIN) 500 MG tablet Take 1 tablet by mouth as needed     0    montelukast (SINGULAIR) 10 MG tablet Take 1 tablet (10 mg total) by mouth every evening 90 tablet 3    naproxen (NAPROSYN) 500 MG tablet Take 1 tablet by mouth as needed     1    omeprazole (PRILOSEC) 40 MG capsule Take 1 capsule (40 mg total) by mouth daily 90 capsule 1    oxyCODONE-acetaminophen (PERCOCET) 5-325 MG per tablet TAKE 1 TABLET TWICE DAILY TO THREE TIMES DAILY AS NEEDED      pimecrolimus (ELIDEL) 1 % cream APPLY TO THE AFFECTED AREA ON THE FACE TWICE DAILY  2    sildenafil (VIAGRA) 100 MG tablet Take 1 tablet (100 mg total) by mouth as needed for Erectile Dysfunction 18 tablet 2    tamsulosin (FLOMAX) 0.4 MG Cap Take 1 capsule (0.4 mg total) by mouth daily 90 capsule 3     No current facility-administered medications for this visit.        Allergies   Allergen Reactions    Methimazole Swelling     tapazole    Pennsaid [Diclofenac Sodium]      Rash burning sensation/ topical       SUBJECTIVE     Chief Complaint   Patient presents with    Annual Exam     He is fasting. Needs to renew pain clinic, dermatologist referrals.           HPI  Visit Type: Health Maintenance Visit    Pt is here today for his physical and last physical was over a year.    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    Reported Health: good health  Diet: inconsistent compliance with recommended diet and well-balanced diet  Exercise: none to due joint pain  Dental: regular dental visits twice a year  Vision: glasses and regular eye exams   Hearing: slightly  decreased hearing- chronic tinnitus- sees audiologist  Immunization Status: td , flu and shingles due today  Reproductive Health: sexually active  Prior Screening Tests: last PSA > 1 year ago and colonoscopy within past 5 years  General Health Risks: no family history of prostate cancer and family history of colon cancer- first cousins  Safety Elements Used: uses seat belts, sunscreen use, does not text and drive and no guns at home    ROS     Review of Systems   Constitutional: Negative for fever, chills, activity change, appetite change, fatigue and unexpected weight change.   HENT: Negative for congestion, dental problem, ear pain, sore throat, trouble swallowing and voice change.    Eyes: Negative for visual disturbance.   Respiratory: Negative for  cough, chest tightness, shortness of breath and wheezing.    Cardiovascular: Negative for chest pain, palpitations and leg swelling.   Gastrointestinal: Negative for nausea, vomiting, abdominal pain, diarrhea, constipation, blood in stool, abdominal distention and rectal pain.   Endocrine: Negative for cold intolerance, heat intolerance, polydipsia, polyphagia and polyuria.   Genitourinary: Negative for dysuria, urgency, frequency, hematuria, flank pain, decreased urine volume, difficulty urinating and genital sores.   Musculoskeletal: Negative for myalgias, back pain and gait problem.   Skin: Negative for pallor, rash and wound.   Neurological: Negative for dizziness, tremors, syncope, speech difficulty, weakness, light-headedness, numbness and headaches.   Hematological: Negative for adenopathy. Does not bruise/bleed easily.   Psychiatric/Behavioral: Negative for sleep disturbance and dysphoric mood. The patient is not nervous/anxious.          The following portions of the patient's history were reviewed and updated as appropriate: Allergies, Current Medications, Past Family History, Past Medical history, Past social history, Past surgical history, and Problem  List.      PHYSICAL EXAM       Vitals:    02/06/19 1026   BP: 106/67   BP Site: Left arm   Patient Position: Sitting   Cuff Size: Large   Pulse: 76   Resp: 18   Temp: 98 F (36.7 C)   SpO2: 98%   Weight: 108.9 kg (240 lb)   Height: 1.753 m (5\' 9" )     Body mass index is 35.44 kg/m.    Physical Exam   Nursing note and vitals reviewed.  Constitutional: He is oriented to person, place, and time. He appears well-developed and well-nourished. No distress.   HENT:   Head: Normocephalic and atraumatic.   Right Ear: Tympanic membrane and external ear normal.   Left Ear: Tympanic membrane and external ear normal.   Nose: Nose normal. No mucosal edema. Right sinus exhibits no maxillary sinus tenderness and no frontal sinus tenderness. Left sinus exhibits no maxillary sinus tenderness and no frontal sinus tenderness.   Mouth/Throat: Uvula is midline, oropharynx is clear and moist and mucous membranes are normal. No oropharyngeal exudate, posterior oropharyngeal edema or posterior oropharyngeal erythema.   Eyes: Conjunctivae and EOM are normal. Pupils are equal, round, and reactive to light.   Neck: Normal range of motion. Neck supple.   Cardiovascular: Normal rate, regular rhythm, normal heart sounds and normal pulses.  Exam reveals no gallop and no friction rub.    No murmur heard.  Pulmonary/Chest: Effort normal and breath sounds normal. No respiratory distress. He has no wheezes. He has no rales.   Abdominal: Soft. Bowel sounds are normal. He exhibits no distension and no mass. There is no tenderness.  GU: not examined today  hepatosplenomegaly. There is no tenderness.   Musculoskeletal: Normal range of motion.   Lymphadenopathy:     He has no cervical adenopathy.   Neurological: He is alert and oriented to person, place, and time. He has normal strength and normal reflexes. He displays no tremor. No cranial nerve deficit or sensory deficit. Gait normal.   Skin: Skin is warm and dry. No lesion and no rash noted. No  pallor.   Psychiatric: He has a normal mood and affect. His speech is normal and behavior is normal. Judgment and thought content normal. Cognition and memory are normal.             No results found for this or any previous visit.    Signed,  Balinda Quails, FNP-c  02/06/2019

## 2019-02-06 NOTE — Progress Notes (Signed)
Have you seen any specialists/other providers since your last visit with us?    No    Arm preference verified?   Yes    The patient is due for shingles vaccine and Hep C Screening

## 2019-02-07 ENCOUNTER — Encounter (INDEPENDENT_AMBULATORY_CARE_PROVIDER_SITE_OTHER): Payer: Self-pay

## 2019-02-07 LAB — T3, FREE: T3, Free: 3.28 pg/mL (ref 1.71–3.71)

## 2019-02-17 ENCOUNTER — Encounter (INDEPENDENT_AMBULATORY_CARE_PROVIDER_SITE_OTHER): Payer: Self-pay | Admitting: Family

## 2019-02-19 ENCOUNTER — Other Ambulatory Visit (INDEPENDENT_AMBULATORY_CARE_PROVIDER_SITE_OTHER): Payer: Self-pay | Admitting: Family

## 2019-02-19 DIAGNOSIS — K219 Gastro-esophageal reflux disease without esophagitis: Secondary | ICD-10-CM

## 2019-03-06 ENCOUNTER — Telehealth (INDEPENDENT_AMBULATORY_CARE_PROVIDER_SITE_OTHER): Payer: TRICARE Prime—HMO | Admitting: Family

## 2019-03-14 ENCOUNTER — Encounter (INDEPENDENT_AMBULATORY_CARE_PROVIDER_SITE_OTHER): Payer: Self-pay | Admitting: Family

## 2019-03-21 ENCOUNTER — Other Ambulatory Visit (INDEPENDENT_AMBULATORY_CARE_PROVIDER_SITE_OTHER): Payer: Self-pay | Admitting: Family

## 2019-03-21 DIAGNOSIS — K219 Gastro-esophageal reflux disease without esophagitis: Secondary | ICD-10-CM

## 2019-04-08 ENCOUNTER — Ambulatory Visit (INDEPENDENT_AMBULATORY_CARE_PROVIDER_SITE_OTHER): Payer: TRICARE Prime—HMO

## 2019-04-29 ENCOUNTER — Other Ambulatory Visit (INDEPENDENT_AMBULATORY_CARE_PROVIDER_SITE_OTHER): Payer: Self-pay | Admitting: Family

## 2019-04-29 DIAGNOSIS — K219 Gastro-esophageal reflux disease without esophagitis: Secondary | ICD-10-CM

## 2019-04-30 NOTE — Telephone Encounter (Signed)
omeprazole (PriLOSEC) 40 MG capsule 90 capsule 1 03/21/2019     Sig: TAKE ONE CAPSULE BY MOUTH EVERY DAY    Sent to pharmacy as: omeprazole (PriLOSEC) 40 MG capsule    Class: E-Rx    E-Prescribing Status: Receipt confirmed by pharmacy (03/21/2019 8:00 AM EST)    Associated Diagnoses    Gastroesophageal reflux disease without esophagitis       Pharmacy    [id number] - [place], [place] - [id number]     Already sent

## 2019-05-05 ENCOUNTER — Ambulatory Visit (INDEPENDENT_AMBULATORY_CARE_PROVIDER_SITE_OTHER): Payer: TRICARE Prime—HMO

## 2019-05-12 ENCOUNTER — Ambulatory Visit (INDEPENDENT_AMBULATORY_CARE_PROVIDER_SITE_OTHER): Payer: TRICARE Prime—HMO

## 2019-05-14 ENCOUNTER — Other Ambulatory Visit (INDEPENDENT_AMBULATORY_CARE_PROVIDER_SITE_OTHER): Payer: Self-pay

## 2019-05-14 ENCOUNTER — Ambulatory Visit (INDEPENDENT_AMBULATORY_CARE_PROVIDER_SITE_OTHER): Payer: TRICARE Prime—HMO

## 2019-05-14 DIAGNOSIS — Z23 Encounter for immunization: Secondary | ICD-10-CM

## 2019-05-14 NOTE — Progress Notes (Signed)
No vaccine qued up what is needed

## 2019-05-14 NOTE — Progress Notes (Signed)
Vaccination administration:  Patient presented to the office for the following vaccination administration:    Shingrix given in the Left deltoid    No reactions were noted and patient left in good condition.

## 2019-05-15 ENCOUNTER — Other Ambulatory Visit (INDEPENDENT_AMBULATORY_CARE_PROVIDER_SITE_OTHER): Payer: Self-pay | Admitting: Family

## 2019-05-15 DIAGNOSIS — E0500 Thyrotoxicosis with diffuse goiter without thyrotoxic crisis or storm: Secondary | ICD-10-CM

## 2019-05-15 NOTE — Telephone Encounter (Signed)
Called and left a detailed message.

## 2019-05-15 NOTE — Telephone Encounter (Signed)
Gave a 6 month supply in October and pt had told me he was moving - can you call and be sure he has refill and has gotten established with new provider. If not moving or wants me to continue refills schedule f/u appt

## 2019-05-17 ENCOUNTER — Encounter (INDEPENDENT_AMBULATORY_CARE_PROVIDER_SITE_OTHER): Payer: Self-pay | Admitting: Family

## 2019-05-21 ENCOUNTER — Other Ambulatory Visit (INDEPENDENT_AMBULATORY_CARE_PROVIDER_SITE_OTHER): Payer: Self-pay | Admitting: Family

## 2019-05-21 DIAGNOSIS — E0500 Thyrotoxicosis with diffuse goiter without thyrotoxic crisis or storm: Secondary | ICD-10-CM

## 2019-05-21 MED ORDER — LEVOTHYROXINE SODIUM 175 MCG PO TABS
175.0000 ug | ORAL_TABLET | Freq: Every day | ORAL | 1 refills | Status: DC
Start: 2019-05-21 — End: 2019-08-07

## 2019-05-23 ENCOUNTER — Encounter (INDEPENDENT_AMBULATORY_CARE_PROVIDER_SITE_OTHER): Payer: Self-pay | Admitting: Family

## 2019-06-01 ENCOUNTER — Other Ambulatory Visit (INDEPENDENT_AMBULATORY_CARE_PROVIDER_SITE_OTHER): Payer: Self-pay | Admitting: Family

## 2019-06-01 DIAGNOSIS — K219 Gastro-esophageal reflux disease without esophagitis: Secondary | ICD-10-CM

## 2019-06-03 ENCOUNTER — Encounter (INDEPENDENT_AMBULATORY_CARE_PROVIDER_SITE_OTHER): Payer: Self-pay

## 2019-06-03 NOTE — Telephone Encounter (Signed)
mychart message sent to patient

## 2019-06-03 NOTE — Telephone Encounter (Signed)
I have reviewed notes from GI appt in sept and per their notes recommendation was to continue pepcid not prilosec as this medication was not listed and get EGD. Has he had EGD yet and is he taking both prilosec and pepcid?

## 2019-06-03 NOTE — Telephone Encounter (Signed)
Called patient. He wasn't aware he wasn't suppose to be taking the Prilosec, they never mentioned that. Just told him what ever works she is is taking both medications, one in the AM, one in the PM. No EGD due to Covid - he is wiating.

## 2019-06-03 NOTE — Telephone Encounter (Signed)
I just reviewed notes and not listed in notes so not sure. I would recommend he discuss with his GI specialist what their recommendation is and get future refill from them until he can get in for his EGD.

## 2019-07-14 ENCOUNTER — Encounter (INDEPENDENT_AMBULATORY_CARE_PROVIDER_SITE_OTHER): Payer: Self-pay | Admitting: Family

## 2019-07-14 ENCOUNTER — Other Ambulatory Visit (INDEPENDENT_AMBULATORY_CARE_PROVIDER_SITE_OTHER): Payer: Self-pay | Admitting: Family

## 2019-07-14 DIAGNOSIS — E0500 Thyrotoxicosis with diffuse goiter without thyrotoxic crisis or storm: Secondary | ICD-10-CM

## 2019-08-01 ENCOUNTER — Encounter (INDEPENDENT_AMBULATORY_CARE_PROVIDER_SITE_OTHER): Payer: Self-pay | Admitting: Family

## 2019-08-07 ENCOUNTER — Encounter (INDEPENDENT_AMBULATORY_CARE_PROVIDER_SITE_OTHER): Payer: Self-pay | Admitting: Family

## 2019-08-07 ENCOUNTER — Ambulatory Visit (INDEPENDENT_AMBULATORY_CARE_PROVIDER_SITE_OTHER): Payer: TRICARE Prime—HMO | Admitting: Family

## 2019-08-07 VITALS — BP 92/61 | HR 71 | Temp 98.2°F | Ht 69.0 in | Wt 236.8 lb

## 2019-08-07 DIAGNOSIS — N401 Enlarged prostate with lower urinary tract symptoms: Secondary | ICD-10-CM

## 2019-08-07 DIAGNOSIS — M25562 Pain in left knee: Secondary | ICD-10-CM

## 2019-08-07 DIAGNOSIS — R3911 Hesitancy of micturition: Secondary | ICD-10-CM

## 2019-08-07 DIAGNOSIS — Z1283 Encounter for screening for malignant neoplasm of skin: Secondary | ICD-10-CM

## 2019-08-07 DIAGNOSIS — N529 Male erectile dysfunction, unspecified: Secondary | ICD-10-CM

## 2019-08-07 DIAGNOSIS — E0500 Thyrotoxicosis with diffuse goiter without thyrotoxic crisis or storm: Secondary | ICD-10-CM

## 2019-08-07 DIAGNOSIS — M545 Low back pain: Secondary | ICD-10-CM

## 2019-08-07 DIAGNOSIS — Z9109 Other allergy status, other than to drugs and biological substances: Secondary | ICD-10-CM

## 2019-08-07 DIAGNOSIS — E782 Mixed hyperlipidemia: Secondary | ICD-10-CM

## 2019-08-07 DIAGNOSIS — K219 Gastro-esophageal reflux disease without esophagitis: Secondary | ICD-10-CM

## 2019-08-07 DIAGNOSIS — G8929 Other chronic pain: Secondary | ICD-10-CM

## 2019-08-07 DIAGNOSIS — M25561 Pain in right knee: Secondary | ICD-10-CM

## 2019-08-07 DIAGNOSIS — L80 Vitiligo: Secondary | ICD-10-CM

## 2019-08-07 MED ORDER — MONTELUKAST SODIUM 10 MG PO TABS
10.0000 mg | ORAL_TABLET | Freq: Every evening | ORAL | 3 refills | Status: DC
Start: 2019-08-07 — End: 2020-09-07

## 2019-08-07 MED ORDER — SILDENAFIL CITRATE 100 MG PO TABS
100.0000 mg | ORAL_TABLET | ORAL | 2 refills | Status: DC | PRN
Start: 2019-08-07 — End: 2020-09-07

## 2019-08-07 MED ORDER — LORATADINE 10 MG PO TABS
10.0000 mg | ORAL_TABLET | Freq: Every day | ORAL | 3 refills | Status: DC
Start: 2019-08-07 — End: 2019-09-25

## 2019-08-07 MED ORDER — FAMOTIDINE 40 MG PO TABS
40.0000 mg | ORAL_TABLET | Freq: Every evening | ORAL | 3 refills | Status: DC | PRN
Start: 2019-08-07 — End: 2020-08-24

## 2019-08-07 MED ORDER — ATORVASTATIN CALCIUM 40 MG PO TABS
40.0000 mg | ORAL_TABLET | Freq: Every day | ORAL | 3 refills | Status: DC
Start: 2019-08-07 — End: 2020-09-07

## 2019-08-07 MED ORDER — TAMSULOSIN HCL 0.4 MG PO CAPS
0.40 mg | ORAL_CAPSULE | Freq: Every day | ORAL | 3 refills | Status: DC
Start: 2019-08-07 — End: 2020-09-07

## 2019-08-07 MED ORDER — LEVOTHYROXINE SODIUM 175 MCG PO TABS
175.0000 ug | ORAL_TABLET | Freq: Every day | ORAL | 3 refills | Status: DC
Start: 2019-08-07 — End: 2020-06-16

## 2019-08-07 MED ORDER — OMEPRAZOLE 40 MG PO CPDR
40.00 mg | DELAYED_RELEASE_CAPSULE | Freq: Every day | ORAL | 3 refills | Status: DC
Start: 2019-08-07 — End: 2020-04-19

## 2019-08-07 NOTE — Progress Notes (Signed)
Have you seen any specialists/other providers since your last visit with us?    Yes    Arm preference verified?   Yes    The patient is due for depression screening and advance directive on file

## 2019-08-07 NOTE — Progress Notes (Signed)
[facility] [place]    PROGRESS NOTE       ASSESSMENT/PLAN     Justin Peterson is a 64 y.o. male    Chief Complaint   Patient presents with    Hypothyroidism     Follow up and med refill. Doing ok on the medication.     Hyperlipidemia     Follow up and med refill. Doing ok on the medication. He is fasting.     Referrals     New referrals needed to pain doctor, dermatologist and gastro.         1. Graves' disease  - levothyroxine (SYNTHROID) 175 MCG tablet; Take 1 tablet (175 mcg total) by mouth daily  Dispense: 90 tablet; Refill: 3    2. Mixed hyperlipidemia  - atorvastatin (LIPITOR) 40 MG tablet; Take 1 tablet (40 mg total) by mouth daily  Dispense: 90 tablet; Refill: 3    Pt prefers to hold on labs as well controlled and will return for labs in 6 months with physical.     3. Gastroesophageal reflux disease without esophagitis  - Ambulatory referral to Gastroenterology  - omeprazole (PriLOSEC) 40 MG capsule; Take 1 capsule (40 mg total) by mouth daily  Dispense: 90 capsule; Refill: 3  - famotidine (PEPCID) 40 MG tablet; Take 1 tablet (40 mg total) by mouth nightly as needed for Heartburn  Dispense: 90 tablet; Refill: 3    4. Erectile dysfunction, unspecified erectile dysfunction type  - sildenafil (VIAGRA) 100 MG tablet; Take 1 tablet (100 mg total) by mouth as needed for Erectile Dysfunction  Dispense: 18 tablet; Refill: 2    5. Benign prostatic hyperplasia with urinary hesitancy  - tamsulosin (FLOMAX) 0.4 MG Cap; Take 1 capsule (0.4 mg total) by mouth daily  Dispense: 90 capsule; Refill: 3    Chronic, stable; continue current meds  Doe not feel he needs urologist  PSA due in 6 months    6. Skin cancer screening  - Ambulatory referral to Dermatology    7. Vitiligo  - Ambulatory referral to Dermatology    8. Chronic bilateral low back pain, unspecified whether sciatica present  - Ambulatory referral to Pain Clinic    9. Chronic pain of both knees  - Ambulatory referral to Pain Clinic    10.  Environmental allergies  - montelukast (SINGULAIR) 10 MG tablet; Take 1 tablet (10 mg total) by mouth every evening  Dispense: 90 tablet; Refill: 3  - loratadine (CLARITIN) 10 MG tablet; Take 1 tablet (10 mg total) by mouth daily  Dispense: 90 tablet; Refill: 3    Chronic, stable; continue current meds    Schedule physical 6 months         Risk & Benefits of the new medication(s) were explained to the patient (and family) who verbalized understanding & agreed to the treatment plan. Patient (family) encouraged to contact me/clinical staff with any questions/concerns      MEDICATIONS     Current Outpatient Medications   Medication Sig Dispense Refill    aspirin EC 81 MG EC tablet Take 81 mg by mouth daily      atorvastatin (LIPITOR) 40 MG tablet Take 1 tablet (40 mg total) by mouth daily 90 tablet 3    desonide (DESOWEN) 0.05 % cream APPLY TO THE AFFECTED AREA ON FACE TWICE DAILY  0    famotidine (PEPCID) 40 MG tablet Take 1 tablet (40 mg total) by mouth nightly as needed for Heartburn 90 tablet 3  Glucosamine 500 MG Cap Take by mouth 3 (three) times daily      levothyroxine (SYNTHROID) 175 MCG tablet Take 1 tablet (175 mcg total) by mouth daily 90 tablet 3    lidocaine (LIDODERM) 5 % as needed     0    loratadine (CLARITIN) 10 MG tablet Take 1 tablet (10 mg total) by mouth daily 90 tablet 3    methocarbamol (ROBAXIN) 500 MG tablet Take 1 tablet by mouth as needed     0    montelukast (SINGULAIR) 10 MG tablet Take 1 tablet (10 mg total) by mouth every evening 90 tablet 3    naproxen (NAPROSYN) 500 MG tablet Take 1 tablet by mouth as needed     1    omeprazole (PriLOSEC) 40 MG capsule Take 1 capsule (40 mg total) by mouth daily 90 capsule 3    oxyCODONE-acetaminophen (PERCOCET) 7.5-325 MG per tablet       pimecrolimus (ELIDEL) 1 % cream APPLY TO THE AFFECTED AREA ON THE FACE TWICE DAILY  2    sildenafil (VIAGRA) 100 MG tablet Take 1 tablet (100 mg total) by mouth as needed for Erectile Dysfunction 18  tablet 2    tamsulosin (FLOMAX) 0.4 MG Cap Take 1 capsule (0.4 mg total) by mouth daily 90 capsule 3     No current facility-administered medications for this visit.       Allergies   Allergen Reactions    Methimazole Swelling     tapazole    Pennsaid [Diclofenac Sodium]      Rash burning sensation/ topical       SUBJECTIVE     Chief Complaint   Patient presents with    Hypothyroidism     Follow up and med refill. Doing ok on the medication.     Hyperlipidemia     Follow up and med refill. Doing ok on the medication. He is fasting.     Referrals     New referrals needed to pain doctor, dermatologist and gastro.         HPI    64 year old male presents in office to f/u medication refills and referrals. All chronic conditions stable and no concerns.    Marland Kitchen    ROS     General/Constitutional:   Denies Chills. Denies Fatigue. Denies Fever.   Ophthalmologic:   Denies Blurred vision.   ENT:   Denies Nasal Discharge. Denies Sinus pain. Denies Sore throat.   Respiratory:   Denies Cough. Denies Shortness of breath. Denies Wheezing.   Cardiovascular:   Denies Chest pain. Denies Chest pain with exertion. Denies Palpitations. Denies  Swelling in hands/feet.   Gastrointestinal:   Denies Abdominal pain. Denies Constipation. Denies Diarrhea. Denies Nausea. Denies  Vomiting.   Skin:   Denies Rash.   Neurologic:   Denies Dizziness. Denies Headache. Denies Tingling/Numbness.       The following portions of the patient's history were reviewed and updated as appropriate: Allergies, Current Medications, Past Family History, Past Medical history, Past social history, Past surgical history, and Problem List.      PHYSICAL EXAM       Vitals:    08/07/19 1018   BP: 92/61   Pulse: 71   Temp: 98.2 F (36.8 C)   SpO2: 95%   Weight: 107.4 kg (236 lb 12.8 oz)   Height: 1.753 m (5\' 9" )     Body mass index is 34.97 kg/m.    Physical Exam   Constitutional:  He is oriented to person, place, and time and well-developed, well-nourished, and in no  distress. No distress.   HENT:   Head: Normocephalic and atraumatic.   Eyes: Conjunctivae are normal.   Neck: No thyromegaly present.   Cardiovascular: Normal rate, regular rhythm and normal heart sounds. Exam reveals no gallop and no friction rub.   No murmur heard.  Pulmonary/Chest: Effort normal and breath sounds normal. No respiratory distress. He has no wheezes. He has no rales.   Musculoskeletal:         General: No edema.      Cervical back: Normal range of motion and neck supple.   Lymphadenopathy:     He has no cervical adenopathy.   Neurological: He is alert and oriented to person, place, and time.   Skin: He is not diaphoretic.   Psychiatric: Mood, memory, affect and judgment normal.   Nursing note and vitals reviewed.        Results for orders placed or performed in visit on 02/06/19   CBC and differential   Result Value Ref Range    WBC 6.10 3.10 - 9.50 x10 3/uL    Hgb 14.3 12.5 - 17.1 g/dL    Hematocrit 41.3 24.4 - 49.6 %    Platelets 281 142 - 346 x10 3/uL    RBC 4.67 4.20 - 5.90 x10 6/uL    MCV 90.8 78.0 - 96.0 fL    MCH 30.6 25.1 - 33.5 pg    MCHC 33.7 31.5 - 35.8 g/dL    RDW 13 11 - 15 %    MPV 9.6 8.9 - 12.5 fL    Neutrophils 64.6 None %    Lymphocytes Automated 19.7 None %    Monocytes 8.4 None %    Eosinophils Automated 5.9 None %    Basophils Automated 1.1 None %    Immature Granulocytes 0.3 None %    Nucleated RBC 0.0 0.0 - 0.0 /100 WBC    Neutrophils Absolute 3.94 1.10 - 6.33 x10 3/uL    Lymphocytes Absolute Automated 1.20 0.42 - 3.22 x10 3/uL    Monocytes Absolute Automated 0.51 0.21 - 0.85 x10 3/uL    Eosinophils Absolute Automated 0.36 0.00 - 0.44 x10 3/uL    Basophils Absolute Automated 0.07 0.00 - 0.08 x10 3/uL    Immature Granulocytes Absolute 0.02 0.00 - 0.07 x10 3/uL    Absolute NRBC 0.00 0.00 - 0.00 x10 3/uL   Comprehensive metabolic panel   Result Value Ref Range    Glucose 98 70 - 100 mg/dL    BUN 01.0 9.0 - 27.2 mg/dL    Creatinine 0.9 0.5 - 1.5 mg/dL    Sodium 536 644 - 034 mEq/L     Potassium 4.2 3.5 - 5.1 mEq/L    Chloride 106 100 - 111 mEq/L    CO2 24 21 - 29 mEq/L    Calcium 9.9 8.5 - 10.5 mg/dL    Protein, Total 7.3 6.0 - 8.3 g/dL    Albumin 4.8 3.5 - 5.0 g/dL    AST (SGOT) 23 5 - 34 U/L    ALT 27 0 - 55 U/L    Alkaline Phosphatase 94 38 - 106 U/L    Bilirubin, Total 0.6 0.2 - 1.2 mg/dL    Globulin 2.5 2.0 - 3.7 g/dL    Albumin/Globulin Ratio 1.9 0.9 - 2.2    Anion Gap 12.0 5.0 - 15.0   TSH   Result Value Ref Range    TSH  0.04 (L) 0.35 - 4.94 uIU/mL   Lipid panel   Result Value Ref Range    Cholesterol 132 0 - 199 mg/dL    Triglycerides 71 34 - 149 mg/dL    HDL 41 40 - 1,610 mg/dL    LDL Calculated 77 0 - 99 mg/dL    VLDL Calculated 14 10 - 40 mg/dL    Cholesterol / HDL Ratio 3.2 See Below   Urinalysis   Result Value Ref Range    Urine Type Midstream     Color, UA YELLOW Colorless - Yellow    Clarity, UA CLEAR Clear - Hazy    Specific Gravity UA 1.007 1.001 - 1.035    Urine pH 6.5 5.0 - 8.0    Leukocyte Esterase, UA NEGATIVE Negative    Nitrite, UA NEGATIVE Negative    Protein, UR NEGATIVE Negative    Glucose, UA NEGATIVE Negative    Ketones UA NEGATIVE Negative    Urobilinogen, UA 0.2 0.2 - 2.0    Bilirubin, UA NEGATIVE Negative    Blood, UA NEGATIVE Negative   Hemoglobin A1C   Result Value Ref Range    Hemoglobin A1C 5.4 4.6 - 5.9 %    Average Estimated Glucose 108.3 mg/dL   PSA   Result Value Ref Range    Prostate Specific Antigen, Total 1.065 0.000 - 4.000 ng/mL   T3, free   Result Value Ref Range    T3, Free 3.28 1.71 - 3.71 pg/mL   T4, free   Result Value Ref Range    T4 Free 1.50 (H) 0.70 - 1.48 ng/dL   Vitamin R,60 OH, Total   Result Value Ref Range    Vitamin D, 25 OH, Total 56 30 - 100 ng/mL   Hemolysis index   Result Value Ref Range    Hemolysis Index 43 (H) 0 - 18   GFR   Result Value Ref Range    EGFR >60.0    Hepatitis C (HCV) antibody, Total   Result Value Ref Range    Hepatitis C, AB Non-Reactive Non Reactive       Signed,  Balinda Quails, FNP-c  08/07/2019

## 2019-09-03 ENCOUNTER — Encounter (INDEPENDENT_AMBULATORY_CARE_PROVIDER_SITE_OTHER): Payer: Self-pay | Admitting: Family

## 2019-09-24 ENCOUNTER — Other Ambulatory Visit (INDEPENDENT_AMBULATORY_CARE_PROVIDER_SITE_OTHER): Payer: Self-pay | Admitting: Family

## 2019-09-24 DIAGNOSIS — Z9109 Other allergy status, other than to drugs and biological substances: Secondary | ICD-10-CM

## 2019-10-21 ENCOUNTER — Encounter (INDEPENDENT_AMBULATORY_CARE_PROVIDER_SITE_OTHER): Payer: Self-pay | Admitting: Family

## 2019-11-20 ENCOUNTER — Encounter (INDEPENDENT_AMBULATORY_CARE_PROVIDER_SITE_OTHER): Payer: Self-pay | Admitting: Family

## 2019-11-26 ENCOUNTER — Encounter (INDEPENDENT_AMBULATORY_CARE_PROVIDER_SITE_OTHER): Payer: Self-pay | Admitting: Family

## 2019-11-27 ENCOUNTER — Encounter (INDEPENDENT_AMBULATORY_CARE_PROVIDER_SITE_OTHER): Payer: Self-pay | Admitting: Family

## 2019-12-01 ENCOUNTER — Other Ambulatory Visit (INDEPENDENT_AMBULATORY_CARE_PROVIDER_SITE_OTHER): Payer: Self-pay | Admitting: Family

## 2019-12-01 DIAGNOSIS — M545 Low back pain, unspecified: Secondary | ICD-10-CM

## 2019-12-01 DIAGNOSIS — G8929 Other chronic pain: Secondary | ICD-10-CM

## 2019-12-04 ENCOUNTER — Encounter (INDEPENDENT_AMBULATORY_CARE_PROVIDER_SITE_OTHER): Payer: Self-pay

## 2019-12-15 ENCOUNTER — Encounter (INDEPENDENT_AMBULATORY_CARE_PROVIDER_SITE_OTHER): Payer: Self-pay | Admitting: Family

## 2019-12-17 ENCOUNTER — Encounter (INDEPENDENT_AMBULATORY_CARE_PROVIDER_SITE_OTHER): Payer: Self-pay | Admitting: Family

## 2019-12-19 ENCOUNTER — Encounter (INDEPENDENT_AMBULATORY_CARE_PROVIDER_SITE_OTHER): Payer: Self-pay

## 2020-01-14 ENCOUNTER — Encounter (INDEPENDENT_AMBULATORY_CARE_PROVIDER_SITE_OTHER): Payer: Self-pay | Admitting: Family

## 2020-02-06 ENCOUNTER — Encounter (INDEPENDENT_AMBULATORY_CARE_PROVIDER_SITE_OTHER): Payer: Self-pay | Admitting: Family

## 2020-02-06 ENCOUNTER — Ambulatory Visit (INDEPENDENT_AMBULATORY_CARE_PROVIDER_SITE_OTHER): Payer: TRICARE Prime—HMO | Admitting: Family

## 2020-02-06 VITALS — BP 99/62 | HR 70 | Temp 98.0°F | Ht 69.0 in | Wt 234.2 lb

## 2020-02-06 DIAGNOSIS — K219 Gastro-esophageal reflux disease without esophagitis: Secondary | ICD-10-CM

## 2020-02-06 DIAGNOSIS — E0500 Thyrotoxicosis with diffuse goiter without thyrotoxic crisis or storm: Secondary | ICD-10-CM

## 2020-02-06 DIAGNOSIS — Z0000 Encounter for general adult medical examination without abnormal findings: Secondary | ICD-10-CM

## 2020-02-06 DIAGNOSIS — E782 Mixed hyperlipidemia: Secondary | ICD-10-CM

## 2020-02-06 DIAGNOSIS — Z23 Encounter for immunization: Secondary | ICD-10-CM

## 2020-02-06 DIAGNOSIS — N529 Male erectile dysfunction, unspecified: Secondary | ICD-10-CM

## 2020-02-06 DIAGNOSIS — E669 Obesity, unspecified: Secondary | ICD-10-CM

## 2020-02-06 LAB — CBC AND DIFFERENTIAL
Absolute NRBC: 0 10*3/uL (ref 0.00–0.00)
Basophils Absolute Automated: 0.05 10*3/uL (ref 0.00–0.08)
Basophils Automated: 0.8 %
Eosinophils Absolute Automated: 0.35 10*3/uL (ref 0.00–0.44)
Eosinophils Automated: 5.7 %
Hematocrit: 42 % (ref 37.6–49.6)
Hgb: 14.4 g/dL (ref 12.5–17.1)
Immature Granulocytes Absolute: 0.01 10*3/uL (ref 0.00–0.07)
Immature Granulocytes: 0.2 %
Lymphocytes Absolute Automated: 0.96 10*3/uL (ref 0.42–3.22)
Lymphocytes Automated: 15.6 %
MCH: 31.4 pg (ref 25.1–33.5)
MCHC: 34.3 g/dL (ref 31.5–35.8)
MCV: 91.5 fL (ref 78.0–96.0)
MPV: 9.6 fL (ref 8.9–12.5)
Monocytes Absolute Automated: 0.53 10*3/uL (ref 0.21–0.85)
Monocytes: 8.6 %
Neutrophils Absolute: 4.24 10*3/uL (ref 1.10–6.33)
Neutrophils: 69.1 %
Nucleated RBC: 0 /100 WBC (ref 0.0–0.0)
Platelets: 241 10*3/uL (ref 142–346)
RBC: 4.59 10*6/uL (ref 4.20–5.90)
RDW: 12 % (ref 11–15)
WBC: 6.14 10*3/uL (ref 3.10–9.50)

## 2020-02-06 LAB — COMPREHENSIVE METABOLIC PANEL
ALT: 26 U/L (ref 0–55)
AST (SGOT): 24 U/L (ref 5–34)
Albumin/Globulin Ratio: 1.9 (ref 0.9–2.2)
Albumin: 4.6 g/dL (ref 3.5–5.0)
Alkaline Phosphatase: 94 U/L (ref 37–117)
Anion Gap: 8 (ref 5.0–15.0)
BUN: 14 mg/dL (ref 9.0–28.0)
Bilirubin, Total: 0.6 mg/dL (ref 0.2–1.2)
CO2: 27 mEq/L (ref 21–29)
Calcium: 10.1 mg/dL (ref 8.5–10.5)
Chloride: 106 mEq/L (ref 100–111)
Creatinine: 0.9 mg/dL (ref 0.5–1.5)
Globulin: 2.4 g/dL (ref 2.0–3.7)
Glucose: 103 mg/dL — ABNORMAL HIGH (ref 70–100)
Potassium: 4.4 mEq/L (ref 3.5–5.1)
Protein, Total: 7 g/dL (ref 6.0–8.3)
Sodium: 141 mEq/L (ref 136–145)

## 2020-02-06 LAB — LIPID PANEL
Cholesterol / HDL Ratio: 3.1
Cholesterol: 128 mg/dL (ref 0–199)
HDL: 41 mg/dL (ref 40–9999)
LDL Calculated: 71 mg/dL (ref 0–99)
Triglycerides: 79 mg/dL (ref 34–149)
VLDL Calculated: 16 mg/dL (ref 10–40)

## 2020-02-06 LAB — HEMOGLOBIN A1C
Average Estimated Glucose: 108.3 mg/dL
Hemoglobin A1C: 5.4 % (ref 4.6–5.9)

## 2020-02-06 LAB — T3, FREE: T3, Free: 2.96 pg/mL (ref 1.71–3.71)

## 2020-02-06 LAB — PSA: Prostate Specific Antigen, Total: 0.73 ng/mL (ref 0.000–4.000)

## 2020-02-06 LAB — TSH: TSH: 0.04 u[IU]/mL — ABNORMAL LOW (ref 0.35–4.94)

## 2020-02-06 LAB — T4, FREE: T4 Free: 1.44 ng/dL (ref 0.70–1.48)

## 2020-02-06 LAB — HEMOLYSIS INDEX: Hemolysis Index: 6 (ref 0–18)

## 2020-02-06 LAB — GFR: EGFR: >60

## 2020-02-06 NOTE — Progress Notes (Signed)
[facility] [place]    PROGRESS NOTE         ASSESSMENT/PLAN     Justin Peterson is a 64 y.o. male    Chief Complaint   Patient presents with    Annual Exam        1. Need for influenza vaccination  - Flu vaccine QUADRIVALENT (PF) 6 months and older (FLULAVAL/FLUARIX)    2. Annual physical exam  - CBC and differential  - Comprehensive metabolic panel  - TSH  - Lipid panel  - Hemoglobin A1C  - PSA    Health promotion and disease prevention/screening recommendations discussed.    Exercise: Optimize aerobic exercise per ACC guidelines of 150 min per week.     Healthy eating: Aim for (8) 8 oz glasses  of water per day. Dietary fiber 25 grams per day. Optimize weight by eating a well balanced diet and encouraged daily calcium and vitamin D. Heart healthy diet encouraged, such as limiting saturated fat and trans fat and replacing these fats with the poly and mono unsaturated fats. Plant sterols, fish oil, fiber, and sodium reduction is ideal.     Sleep hygiene: Recommendations for optimizing sleep hygiene includes: counseled on sleep hygiene (getting 6 hrs sleep minimum) 1) regular sleep times 2) Avoid caffeine 4-6 hours prior to going to bed 3) Avoid alcohol for at least 4-6 hours before going to bed 4) Engage in regular exercise 5) Keep daytime routine activities the same as planned 6) Stop screen time/viewing 1 hours prior to bedtime and 7) eat a well-balance diet, preferrably eat dinner no later than 2-3 hours prior to going to sleep.Reviewed labs with patient. Patient agreeable to plan.     Safety: seat belts, no texting and driving, no drinking and driving, no riding with people who have been drinking, moderation (calories, caffeine, alcohol etc), domestic violence awareness, recommend annual flu shots.    3. Graves' disease  - TSH  - T3, free  - T4, free    4. Mixed hyperlipidemia  - Lipid panel    5. Gastroesophageal reflux disease without esophagitis    6. Erectile dysfunction, unspecified erectile  dysfunction type    7. Obesity (BMI 35.0-39.9 without comorbidity)    Discussed the patient's BMI with him.  The BMI is above average; BMI management plan is completed.      F/u 6 months for chronic conditions or sooner if any concerns     Health maintenance:  -Fasting blood work: today, reviewed planned labs and pt agrees to them.   -Immunizations: flu vaccine today  -STD screen: declined need as no concerns  -Colonoscopy: UTD  -Vision: UTD  -Dental: UTD      Risk & Benefits of the new medication(s) were explained to the patient (and family) who verbalized understanding & agreed to the treatment plan. Patient (family) encouraged to contact me/clinical staff with any questions/concerns      MEDICATIONS     Current Outpatient Medications   Medication Sig Dispense Refill    Allergy Relief 10 MG tablet TAKE ONE TABLET BY MOUTH EVERY DAY 90 tablet 3    aspirin EC 81 MG EC tablet Take 81 mg by mouth daily      atorvastatin (LIPITOR) 40 MG tablet Take 1 tablet (40 mg total) by mouth daily 90 tablet 3    celecoxib (CeleBREX) 200 MG capsule Take 200 mg by mouth daily as needed      desonide (DESOWEN) 0.05 % cream APPLY TO THE  AFFECTED AREA ON FACE TWICE DAILY  0    famotidine (PEPCID) 40 MG tablet Take 1 tablet (40 mg total) by mouth nightly as needed for Heartburn 90 tablet 3    levothyroxine (SYNTHROID) 175 MCG tablet Take 1 tablet (175 mcg total) by mouth daily 90 tablet 3    lidocaine (LIDODERM) 5 % as needed     0    methocarbamol (ROBAXIN) 500 MG tablet Take 1 tablet by mouth as needed     0    montelukast (SINGULAIR) 10 MG tablet Take 1 tablet (10 mg total) by mouth every evening 90 tablet 3    omeprazole (PriLOSEC) 40 MG capsule Take 1 capsule (40 mg total) by mouth daily (Patient taking differently: Take 40 mg by mouth 2 (two) times daily   ) 90 capsule 3    oxyCODONE-acetaminophen (PERCOCET) 7.5-325 MG per tablet Take 2 tablets by mouth daily         pimecrolimus (ELIDEL) 1 % cream APPLY TO THE  AFFECTED AREA ON THE FACE TWICE DAILY  2    sildenafil (VIAGRA) 100 MG tablet Take 1 tablet (100 mg total) by mouth as needed for Erectile Dysfunction 18 tablet 2    tamsulosin (FLOMAX) 0.4 MG Cap Take 1 capsule (0.4 mg total) by mouth daily 90 capsule 3    Glucosamine 500 MG Cap Take by mouth 3 (three) times daily       No current facility-administered medications for this visit.       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    Reported Health: good health  Diet: good and improved  Exercise: has restarted  Dental: regular dental visits twice a year  Vision: glasses and regular eye exams   Hearing: slightly decreased hearing- chronic tinnitus- sees audiologist- not worse  Immunization Status: flu vaccine today  Reproductive Health: sexually active  Prior Screening Tests: last PSA 1 year ago and colonoscopy within past 5 years  General Health Risks: no family history of prostate cancer and family history of colon cancer- first cousins  Safety Elements Used: uses seat belts, sunscreen use, does not text and drive and no guns at home    ROS     Review of Systems   Constitutional: Negative for fever, chills, activity change, appetite change, fatigue and unexpected weight change.   HENT: Negative for congestion, dental problem, ear pain, sore throat, trouble swallowing and voice change.    Eyes: Negative for visual disturbance.   Respiratory: Negative for cough, chest tightness, shortness of breath and  wheezing.    Cardiovascular: Negative for chest pain, palpitations and leg swelling.   Gastrointestinal: Negative for nausea, vomiting, abdominal pain, diarrhea, constipation, blood in stool, abdominal distention and  rectal pain.   Endocrine: Negative for cold intolerance, heat intolerance, polydipsia, polyphagia and polyuria.   Genitourinary: Negative for dysuria, urgency, frequency, hematuria, flank pain, decreased urine volume, difficulty urinating and genital sores.   Musculoskeletal: Negative for myalgias, back pain and gait problem.   Skin: Negative for pallor, rash and wound.   Neurological: Negative for dizziness, tremors, syncope, speech difficulty, weakness, light-headedness, numbness and headaches.   Hematological: Negative for adenopathy. Does not bruise/bleed easily.   Psychiatric/Behavioral: Negative for sleep disturbance and dysphoric mood. The patient is not nervous/anxious.        The following portions of the patient's history were reviewed and updated as appropriate: Allergies, Current Medications, Past Family History, Past Medical history, Past social history, Past surgical history, and Problem List.      PHYSICAL EXAM       Vitals:    02/06/20 0849   BP: 99/62   Pulse: 70   Temp: 98 F (36.7 C)   SpO2: 94%   Weight: 106.2 kg (234 lb 3.2 oz)   Height: 1.753 m (5\' 9" )     Body mass index is 34.59 kg/m.    Physical Exam   Nursing note and vitals reviewed.  Constitutional: He is oriented to person, place, and time. He appears well-developed and well-nourished. No distress.   HENT:   Head: Normocephalic and atraumatic.   Right Ear: Tympanic membrane and external ear normal.   Left Ear: Tympanic membrane and external ear normal.   Nose: Nose normal. No mucosal edema.  Mouth/Throat: Uvula is midline, oropharynx is clear and moist and mucous membranes are normal. No oropharyngeal exudate, posterior oropharyngeal edema or posterior oropharyngeal erythema.   Eyes: Conjunctivae and EOM are normal. Pupils  are equal, round, and reactive to light.   Neck: Normal range of motion. Neck supple.   Cardiovascular: Normal rate, regular rhythm, normal heart sounds and normal pulses.  Exam reveals no gallop and no friction rub.    No murmur heard.  Pulmonary/Chest: Effort normal and breath sounds normal. No respiratory distress. He has no wheezes. He has no rales.   Abdominal: Soft. Bowel sounds are normal. He exhibits no distension and no mass. There is no tenderness.  GU: not examined today  hepatosplenomegaly. There is no tenderness.   Musculoskeletal: Normal range of motion.   Lymphadenopathy:     He has no cervical adenopathy.   Neurological: He is alert and oriented to person, place, and time. He has normal strength and normal reflexes. He displays no tremor. No cranial nerve deficit or sensory deficit. Gait normal.   Skin: Skin is warm and dry. No lesion and no rash noted. No pallor.   Psychiatric: He has a normal mood and affect. His speech is normal and behavior is normal. Judgment and thought content normal. Cognition and memory are normal.             Results for orders placed or performed in visit on 02/06/19   CBC and differential   Result Value Ref Range    WBC 6.10 3.1 - 9.5 x10 3/uL    Hgb 14.3 12.5 - 17.1 g/dL    Hematocrit 16.1 09.6 - 49.6 %    Platelets 281 142 - 346 x10 3/uL    RBC 4.67 4.20 - 5.90 x10 6/uL    MCV 90.8 78.0 - 96.0 fL    MCH 30.6 25.1 - 33.5 pg    MCHC 33.7 31 - 35 g/dL    RDW 13 04.5 -  15.0 %    MPV 9.6 8.9 - 12.5 fL    Neutrophils 64.6 None %    Lymphocytes Automated 19.7 None %    Monocytes 8.4 None %    Eosinophils Automated 5.9 None %    Basophils Automated 1.1 None %    Immature Granulocytes 0.3 None %    Nucleated RBC 0.0 0.0 - 0.0 /100 WBC    Neutrophils Absolute 3.94 1 - 6 x10 3/uL    Lymphocytes Absolute Automated 1.20 0.42 - 3.22 x10 3/uL    Monocytes Absolute Automated 0.51 0.21 - 0.85 x10 3/uL    Eosinophils Absolute Automated 0.36 0.00 - 0.44 x10 3/uL    Basophils Absolute  Automated 0.07 0.00 - 0.08 x10 3/uL    Immature Granulocytes Absolute 0.02 0.00 - 0.07 x10 3/uL    Absolute NRBC 0.00 0.00 - 0.00 x10 3/uL   Comprehensive metabolic panel   Result Value Ref Range    Glucose 98 70 - 100 mg/dL    BUN 16.1 9 - 28 mg/dL    Creatinine 0.9 0.5 - 1.5 mg/dL    Sodium 096 045 - 409 mEq/L    Potassium 4.2 3.5 - 5.1 mEq/L    Chloride 106 100 - 111 mEq/L    CO2 24 21 - 29 mEq/L    Calcium 9.9 8.5 - 10.5 mg/dL    Protein, Total 7.3 6.0 - 8.3 g/dL    Albumin 4.8 3.5 - 5.0 g/dL    AST (SGOT) 23 5 - 34 U/L    ALT 27 0 - 55 U/L    Alkaline Phosphatase 94 38 - 106 U/L    Bilirubin, Total 0.6 0.2 - 1.2 mg/dL    Globulin 2.5 2.0 - 3.7 g/dL    Albumin/Globulin Ratio 1.9 0.9 - 2.2    Anion Gap 12.0 5 - 15   TSH   Result Value Ref Range    TSH 0.04 (L) 0.35 - 4.94 uIU/mL   Lipid panel   Result Value Ref Range    Cholesterol 132 0 - 199 mg/dL    Triglycerides 71 34 - 149 mg/dL    HDL 41 40 - 8,119 mg/dL    LDL Calculated 77 0 - 99 mg/dL    VLDL Calculated 14 10 - 40 mg/dL    Cholesterol / HDL Ratio 3.2 See Below   Urinalysis   Result Value Ref Range    Urine Type Midstream     Color, UA YELLOW Colorless - Yellow    Clarity, UA CLEAR Clear - Hazy    Specific Gravity UA 1.007 1.001 - 1.035    Urine pH 6.5 5.0 - 8.0    Leukocyte Esterase, UA NEGATIVE Negative    Nitrite, UA NEGATIVE Negative    Protein, UR NEGATIVE Negative    Glucose, UA NEGATIVE Negative    Ketones UA NEGATIVE Negative    Urobilinogen, UA 0.2 0.2 - 2.0    Bilirubin, UA NEGATIVE Negative    Blood, UA NEGATIVE Negative   Hemoglobin A1C   Result Value Ref Range    Hemoglobin A1C 5.4 4.6 - 5.9 %    Average Estimated Glucose 108.3 mg/dL   PSA   Result Value Ref Range    Prostate Specific Antigen, Total 1.065 0.000 - 4.000 ng/mL   T3, free   Result Value Ref Range    T3, Free 3.28 1.71 - 3.71 pg/mL   T4, free   Result Value Ref Range    T4  Free 1.50 (H) 0.70 - 1.48 ng/dL   Vitamin Z,61 OH, Total   Result Value Ref Range    Vitamin D, 25 OH, Total  56 30 - 100 ng/mL   Hemolysis index   Result Value Ref Range    Hemolysis Index 43 (H) 0 - 18   GFR   Result Value Ref Range    EGFR >60.0    Hepatitis C (HCV) antibody, Total   Result Value Ref Range    Hepatitis C, AB Non-Reactive Non Reactive       Signed,  Balinda Quails, FNP, FNP-c  02/06/2020

## 2020-02-06 NOTE — Progress Notes (Signed)
Have you seen any specialists/other providers since your last visit with us?    Yes    Arm preference verified?   Yes    The patient is due for depression screening and advance directive on file

## 2020-02-06 NOTE — Patient Instructions (Signed)
Health promotion and disease prevention/screening recommendations discussed.    Exercise: Optimize aerobic exercise per ACC guidelines of 150 min per week.     Healthy eating: Aim for (8) 8 oz glasses  of water per day. Dietary fiber 25 grams per day. Optimize weight by eating a well balanced diet and encouraged daily calcium and vitamin D. Heart healthy diet encouraged, such as limiting saturated fat and trans fat and replacing these fats with the poly and mono unsaturated fats. Plant sterols, fish oil, fiber, and sodium reduction is ideal.     Sleep hygiene: Recommendations for optimizing sleep hygiene includes: counseled on sleep hygiene (getting 6 hrs sleep minimum) 1) regular sleep times 2) Avoid caffeine 4-6 hours prior to going to bed 3) Avoid alcohol for at least 4-6 hours before going to bed 4) Engage in regular exercise 5) Keep daytime routine activities the same as planned 6) Stop screen time/viewing 1 hours prior to bedtime and 7) eat a well-balance diet, preferrably eat dinner no later than 2-3 hours prior to going to sleep.Reviewed labs with patient. Patient agreeable to plan.     Safety: seat belts, no texting and driving, no drinking and driving, no riding with people who have been drinking, moderation (calories, caffeine, alcohol etc), domestic violence awareness, recommend annual flu shots.

## 2020-02-08 ENCOUNTER — Encounter (INDEPENDENT_AMBULATORY_CARE_PROVIDER_SITE_OTHER): Payer: Self-pay | Admitting: Family

## 2020-02-09 ENCOUNTER — Encounter (INDEPENDENT_AMBULATORY_CARE_PROVIDER_SITE_OTHER): Payer: Self-pay | Admitting: Family

## 2020-02-12 ENCOUNTER — Encounter (INDEPENDENT_AMBULATORY_CARE_PROVIDER_SITE_OTHER): Payer: Self-pay

## 2020-02-13 ENCOUNTER — Encounter (INDEPENDENT_AMBULATORY_CARE_PROVIDER_SITE_OTHER): Payer: Self-pay | Admitting: Family

## 2020-02-17 ENCOUNTER — Encounter (INDEPENDENT_AMBULATORY_CARE_PROVIDER_SITE_OTHER): Payer: Self-pay

## 2020-03-17 ENCOUNTER — Encounter (INDEPENDENT_AMBULATORY_CARE_PROVIDER_SITE_OTHER): Payer: Self-pay | Admitting: Family

## 2020-04-14 ENCOUNTER — Encounter (INDEPENDENT_AMBULATORY_CARE_PROVIDER_SITE_OTHER): Payer: Self-pay | Admitting: Family

## 2020-04-19 ENCOUNTER — Other Ambulatory Visit (INDEPENDENT_AMBULATORY_CARE_PROVIDER_SITE_OTHER): Payer: Self-pay | Admitting: Family

## 2020-04-19 DIAGNOSIS — K219 Gastro-esophageal reflux disease without esophagitis: Secondary | ICD-10-CM

## 2020-04-19 MED ORDER — OMEPRAZOLE 40 MG PO CPDR
40.0000 mg | DELAYED_RELEASE_CAPSULE | Freq: Two times a day (BID) | ORAL | 1 refills | Status: DC
Start: 2020-04-19 — End: 2020-04-22

## 2020-04-22 ENCOUNTER — Other Ambulatory Visit (INDEPENDENT_AMBULATORY_CARE_PROVIDER_SITE_OTHER): Payer: Self-pay | Admitting: Family

## 2020-04-22 DIAGNOSIS — K219 Gastro-esophageal reflux disease without esophagitis: Secondary | ICD-10-CM

## 2020-04-22 MED ORDER — OMEPRAZOLE 40 MG PO CPDR
40.0000 mg | DELAYED_RELEASE_CAPSULE | Freq: Two times a day (BID) | ORAL | 1 refills | Status: DC
Start: 2020-04-22 — End: 2020-07-23

## 2020-04-28 ENCOUNTER — Encounter (INDEPENDENT_AMBULATORY_CARE_PROVIDER_SITE_OTHER): Payer: Self-pay | Admitting: Family

## 2020-06-16 ENCOUNTER — Other Ambulatory Visit (INDEPENDENT_AMBULATORY_CARE_PROVIDER_SITE_OTHER): Payer: Self-pay | Admitting: Family

## 2020-06-16 DIAGNOSIS — E0500 Thyrotoxicosis with diffuse goiter without thyrotoxic crisis or storm: Secondary | ICD-10-CM

## 2020-06-16 MED ORDER — LEVOTHYROXINE SODIUM 175 MCG PO TABS
175.0000 ug | ORAL_TABLET | Freq: Every day | ORAL | 0 refills | Status: DC
Start: 2020-06-16 — End: 2020-09-07

## 2020-06-16 NOTE — Telephone Encounter (Signed)
Last filled April 2021.   Last o/v October 2021.  Patient has an upcoming appointment on Sep 13 2020 with [facility].  Queued up 90 with 0 refills.

## 2020-07-23 ENCOUNTER — Other Ambulatory Visit (INDEPENDENT_AMBULATORY_CARE_PROVIDER_SITE_OTHER): Payer: Self-pay | Admitting: Family

## 2020-07-23 DIAGNOSIS — K219 Gastro-esophageal reflux disease without esophagitis: Secondary | ICD-10-CM

## 2020-07-26 MED ORDER — OMEPRAZOLE 40 MG PO CPDR
40.0000 mg | DELAYED_RELEASE_CAPSULE | Freq: Two times a day (BID) | ORAL | 3 refills | Status: DC
Start: 2020-07-26 — End: 2020-09-07

## 2020-07-26 NOTE — Telephone Encounter (Signed)
Last filled December 2021.   Last o/v October 2021.  Patient has an upcoming appointment on Sep 07 2020 with [facility].  Queued up 180 with 0 refills.

## 2020-07-29 ENCOUNTER — Other Ambulatory Visit (INDEPENDENT_AMBULATORY_CARE_PROVIDER_SITE_OTHER): Payer: Self-pay | Admitting: Family

## 2020-08-24 ENCOUNTER — Other Ambulatory Visit (INDEPENDENT_AMBULATORY_CARE_PROVIDER_SITE_OTHER): Payer: Self-pay | Admitting: Family

## 2020-08-24 DIAGNOSIS — K219 Gastro-esophageal reflux disease without esophagitis: Secondary | ICD-10-CM

## 2020-08-26 ENCOUNTER — Other Ambulatory Visit (INDEPENDENT_AMBULATORY_CARE_PROVIDER_SITE_OTHER): Payer: Self-pay | Admitting: Family

## 2020-08-30 NOTE — Telephone Encounter (Signed)
I can not see where I filled this in the past- who has filled this in the past? Can you double check - also can not fill to [place]. - has he established with new PCP there if he has moved. He is also schedule for office visit on may 10th- does he have enough to last to this appt and we can address at that time.

## 2020-09-02 ENCOUNTER — Encounter (INDEPENDENT_AMBULATORY_CARE_PROVIDER_SITE_OTHER): Payer: Self-pay

## 2020-09-02 NOTE — Telephone Encounter (Signed)
Sent portal message. Waiting for response.

## 2020-09-07 ENCOUNTER — Encounter (INDEPENDENT_AMBULATORY_CARE_PROVIDER_SITE_OTHER): Payer: Self-pay | Admitting: Family

## 2020-09-07 ENCOUNTER — Ambulatory Visit (INDEPENDENT_AMBULATORY_CARE_PROVIDER_SITE_OTHER): Payer: No Typology Code available for payment source | Admitting: Family

## 2020-09-07 VITALS — BP 124/80 | HR 75 | Temp 97.8°F | Ht 68.0 in | Wt 242.1 lb

## 2020-09-07 DIAGNOSIS — K219 Gastro-esophageal reflux disease without esophagitis: Secondary | ICD-10-CM

## 2020-09-07 DIAGNOSIS — E782 Mixed hyperlipidemia: Secondary | ICD-10-CM

## 2020-09-07 DIAGNOSIS — R3911 Hesitancy of micturition: Secondary | ICD-10-CM

## 2020-09-07 DIAGNOSIS — E0500 Thyrotoxicosis with diffuse goiter without thyrotoxic crisis or storm: Secondary | ICD-10-CM

## 2020-09-07 DIAGNOSIS — Z9109 Other allergy status, other than to drugs and biological substances: Secondary | ICD-10-CM

## 2020-09-07 DIAGNOSIS — N401 Enlarged prostate with lower urinary tract symptoms: Secondary | ICD-10-CM

## 2020-09-07 DIAGNOSIS — N529 Male erectile dysfunction, unspecified: Secondary | ICD-10-CM

## 2020-09-07 DIAGNOSIS — E669 Obesity, unspecified: Secondary | ICD-10-CM

## 2020-09-07 MED ORDER — SILDENAFIL CITRATE 100 MG PO TABS
100.0000 mg | ORAL_TABLET | ORAL | 2 refills | Status: AC | PRN
Start: 2020-09-07 — End: ?

## 2020-09-07 MED ORDER — DEXLANSOPRAZOLE 60 MG PO CPDR
1.0000 | DELAYED_RELEASE_CAPSULE | Freq: Every day | ORAL | 1 refills | Status: AC
Start: 2020-09-07 — End: ?

## 2020-09-07 MED ORDER — MONTELUKAST SODIUM 10 MG PO TABS
10.0000 mg | ORAL_TABLET | Freq: Every evening | ORAL | 1 refills | Status: AC
Start: 2020-09-07 — End: ?

## 2020-09-07 MED ORDER — ATORVASTATIN CALCIUM 40 MG PO TABS
40.0000 mg | ORAL_TABLET | Freq: Every day | ORAL | 1 refills | Status: AC
Start: 2020-09-07 — End: ?

## 2020-09-07 MED ORDER — FAMOTIDINE 40 MG PO TABS
40.0000 mg | ORAL_TABLET | Freq: Every evening | ORAL | 1 refills | Status: AC | PRN
Start: 2020-09-07 — End: ?

## 2020-09-07 MED ORDER — LEVOTHYROXINE SODIUM 175 MCG PO TABS
175.0000 ug | ORAL_TABLET | Freq: Every day | ORAL | 1 refills | Status: AC
Start: 2020-09-07 — End: ?

## 2020-09-07 MED ORDER — TAMSULOSIN HCL 0.4 MG PO CAPS
0.4000 mg | ORAL_CAPSULE | Freq: Every day | ORAL | 1 refills | Status: AC
Start: 2020-09-07 — End: ?

## 2020-09-07 NOTE — Progress Notes (Signed)
[facility] [place]    PROGRESS NOTE       ASSESSMENT/PLAN     Justin Peterson is a 65 y.o. male    Chief Complaint   Patient presents with   . Hyperlipidemia     Follow up and med refill. He is fasting today.    Luiz Blare' Disease     Follow up and med refill. Loosing weight is a constant battle, other than that he is doing fine.    . Erectile Dysfunction     Follow up and med refill. Medication works fine.         1. Mixed hyperlipidemia  - atorvastatin (LIPITOR) 40 MG tablet; Take 1 tablet (40 mg total) by mouth daily  Dispense: 90 tablet; Refill: 1    2. Graves' disease  - levothyroxine (SYNTHROID) 175 MCG tablet; Take 1 tablet (175 mcg total) by mouth daily  Dispense: 90 tablet; Refill: 1    3. Gastroesophageal reflux disease without esophagitis  - famotidine (PEPCID) 40 MG tablet; Take 1 tablet (40 mg total) by mouth nightly as needed for Heartburn  Dispense: 90 tablet; Refill: 1  - dexlansoprazole (Dexilant) 60 MG capsule; Take 1 capsule (60 mg total) by mouth daily  Dispense: 90 capsule; Refill: 1    4. Obesity (BMI 35.0-39.9 without comorbidity)    5. Erectile dysfunction, unspecified erectile dysfunction type  - sildenafil (VIAGRA) 100 MG tablet; Take 1 tablet (100 mg total) by mouth as needed for Erectile Dysfunction  Dispense: 18 tablet; Refill: 2    6. Environmental allergies  - montelukast (SINGULAIR) 10 MG tablet; Take 1 tablet (10 mg total) by mouth every evening  Dispense: 90 tablet; Refill: 1    7. Benign prostatic hyperplasia with urinary hesitancy  - tamsulosin (FLOMAX) 0.4 MG Cap; Take 1 capsule (0.4 mg total) by mouth daily  Dispense: 90 capsule; Refill: 1    Pt prefers to hold on labs as last labs 6 months ago normal and well controlled and has moved to [place] and will schedule with PCP there.        Risk & Benefits of the new medication(s) were explained to the patient (and family) who verbalized understanding & agreed to the treatment plan. Patient (family) encouraged to contact  me/clinical staff with any questions/concerns      MEDICATIONS     Current Outpatient Medications   Medication Sig Dispense Refill   . Allergy Relief 10 MG tablet TAKE ONE TABLET BY MOUTH EVERY DAY 90 tablet 3   . celecoxib (CeleBREX) 200 MG capsule Take 200 mg by mouth daily as needed     . desonide (DESOWEN) 0.05 % cream as needed  0   . lidocaine (LIDODERM) 5 % as needed     0   . methocarbamol (ROBAXIN) 500 MG tablet Take 1 tablet by mouth as needed     0   . oxyCODONE-acetaminophen (PERCOCET) 7.5-325 MG per tablet Take 3 tablets by mouth daily     . pimecrolimus (ELIDEL) 1 % cream APPLY TO THE AFFECTED AREA ON THE FACE TWICE DAILY  2   . atorvastatin (LIPITOR) 40 MG tablet Take 1 tablet (40 mg total) by mouth daily 90 tablet 1   . dexlansoprazole (Dexilant) 60 MG capsule Take 1 capsule (60 mg total) by mouth daily 90 capsule 1   . famotidine (PEPCID) 40 MG tablet Take 1 tablet (40 mg total) by mouth nightly as needed for Heartburn 90 tablet 1   .  levothyroxine (SYNTHROID) 175 MCG tablet Take 1 tablet (175 mcg total) by mouth daily 90 tablet 1   . montelukast (SINGULAIR) 10 MG tablet Take 1 tablet (10 mg total) by mouth every evening 90 tablet 1   . sildenafil (VIAGRA) 100 MG tablet Take 1 tablet (100 mg total) by mouth as needed for Erectile Dysfunction 18 tablet 2   . tamsulosin (FLOMAX) 0.4 MG Cap Take 1 capsule (0.4 mg total) by mouth daily 90 capsule 1     No current facility-administered medications for this visit.       Allergies   Allergen Reactions   . Methimazole Swelling     tapazole   . Pennsaid [Diclofenac Sodium]      Rash burning sensation/ topical       SUBJECTIVE     Chief Complaint   Patient presents with   . Hyperlipidemia     Follow up and med refill. He is fasting today.    Luiz Blare' Disease     Follow up and med refill. Loosing weight is a constant battle, other than that he is doing fine.    . Erectile Dysfunction     Follow up and med refill. Medication works fine.         HPI    65 year  old male presents in office to f/u medication refills.  All chronic conditions stable and no concerns.    Has moved to [facility] and will establish with new pcp.      ROS     General/Constitutional:   Denies Chills. Denies Fatigue. Denies Fever.   Ophthalmologic:   Denies Blurred vision.   ENT:   Denies Nasal Discharge. Denies Sinus pain. Denies Sore throat.   Respiratory:   Denies Cough. Denies Shortness of breath. Denies Wheezing.   Cardiovascular:   Denies Chest pain. Denies Chest pain with exertion. Denies Palpitations. Denies  Swelling in hands/feet.   Gastrointestinal:   Denies Abdominal pain. Denies Constipation. Denies Diarrhea. Denies Nausea. Denies  Vomiting.   Skin:   Denies Rash.   Neurologic:   Denies Dizziness. Denies Headache. Denies Tingling/Numbness.       The following portions of the patient's history were reviewed and updated as appropriate: Allergies, Current Medications, Past Family History, Past Medical history, Past social history, Past surgical history, and Problem List.      PHYSICAL EXAM       Vitals:    09/07/20 0850   BP: 124/80   Pulse: 75   Temp: 97.8 F (36.6 C)   SpO2: 97%   Weight: 109.8 kg (242 lb 1.6 oz)   Height: 1.727 m (5\' 8" )     Body mass index is 36.81 kg/m.    Physical Exam  Vitals and nursing note reviewed.   Constitutional:       General: He is not in acute distress.     Appearance: He is not diaphoretic.   HENT:      Head: Normocephalic and atraumatic.   Eyes:      Conjunctiva/sclera: Conjunctivae normal.   Neck:      Thyroid: No thyromegaly.   Cardiovascular:      Rate and Rhythm: Normal rate and regular rhythm.      Heart sounds: Normal heart sounds. No murmur heard.    No friction rub. No gallop.   Pulmonary:      Effort: Pulmonary effort is normal. No respiratory distress.      Breath sounds: Normal breath sounds. No wheezing  or rales.   Musculoskeletal:      Cervical back: Normal range of motion and neck supple.   Lymphadenopathy:      Cervical: No cervical  adenopathy.   Neurological:      Mental Status: He is alert and oriented to person, place, and time.   Psychiatric:         Mood and Affect: Mood and affect normal.         Cognition and Memory: Memory normal.         Judgment: Judgment normal.           Results for orders placed or performed in visit on 02/06/20   CBC and differential   Result Value Ref Range    WBC 6.14 3.10 - 9.50 x10 3/uL    Hgb 14.4 12.5 - 17.1 g/dL    Hematocrit 35.5 73.2 - 49.6 %    Platelets 241 142 - 346 x10 3/uL    RBC 4.59 4.20 - 5.90 x10 6/uL    MCV 91.5 78.0 - 96.0 fL    MCH 31.4 25.1 - 33.5 pg    MCHC 34.3 31.5 - 35.8 g/dL    RDW 12 11 - 15 %    MPV 9.6 8.9 - 12.5 fL    Neutrophils 69.1 None %    Lymphocytes Automated 15.6 None %    Monocytes 8.6 None %    Eosinophils Automated 5.7 None %    Basophils Automated 0.8 None %    Immature Granulocytes 0.2 None %    Nucleated RBC 0.0 0.0 - 0.0 /100 WBC    Neutrophils Absolute 4.24 1.10 - 6.33 x10 3/uL    Lymphocytes Absolute Automated 0.96 0.42 - 3.22 x10 3/uL    Monocytes Absolute Automated 0.53 0.21 - 0.85 x10 3/uL    Eosinophils Absolute Automated 0.35 0.00 - 0.44 x10 3/uL    Basophils Absolute Automated 0.05 0.00 - 0.08 x10 3/uL    Immature Granulocytes Absolute 0.01 0.00 - 0.07 x10 3/uL    Absolute NRBC 0.00 0.00 - 0.00 x10 3/uL   Comprehensive metabolic panel   Result Value Ref Range    Glucose 103 (H) 70 - 100 mg/dL    BUN 20.2 9.0 - 54.2 mg/dL    Creatinine 0.9 0.5 - 1.5 mg/dL    Sodium 706 237 - 628 mEq/L    Potassium 4.4 3.5 - 5.1 mEq/L    Chloride 106 100 - 111 mEq/L    CO2 27 21 - 29 mEq/L    Calcium 10.1 8.5 - 10.5 mg/dL    Protein, Total 7.0 6.0 - 8.3 g/dL    Albumin 4.6 3.5 - 5.0 g/dL    AST (SGOT) 24 5 - 34 U/L    ALT 26 0 - 55 U/L    Alkaline Phosphatase 94 37 - 117 U/L    Bilirubin, Total 0.6 0.2 - 1.2 mg/dL    Globulin 2.4 2.0 - 3.7 g/dL    Albumin/Globulin Ratio 1.9 0.9 - 2.2    Anion Gap 8.0 5.0 - 15.0   TSH   Result Value Ref Range    TSH 0.04 (L) 0.35 - 4.94 uIU/mL    Lipid panel   Result Value Ref Range    Cholesterol 128 0 - 199 mg/dL    Triglycerides 79 34 - 149 mg/dL    HDL 41 40 - 3,151 mg/dL    LDL Calculated 71 0 - 99 mg/dL    VLDL Calculated 16 10 -  40 mg/dL    Cholesterol / HDL Ratio 3.1 See Below   Hemoglobin A1C   Result Value Ref Range    Hemoglobin A1C 5.4 4.6 - 5.9 %    Average Estimated Glucose 108.3 mg/dL   PSA   Result Value Ref Range    Prostate Specific Antigen, Total 0.730 0.000 - 4.000 ng/mL   T3, free   Result Value Ref Range    T3, Free 2.96 1.71 - 3.71 pg/mL   T4, free   Result Value Ref Range    T4 Free 1.44 0.70 - 1.48 ng/dL   GFR   Result Value Ref Range    EGFR >60.0    Hemolysis index   Result Value Ref Range    Hemolysis Index 6 0 - 18       Signed,    Balinda Quails, FNP

## 2020-09-07 NOTE — Progress Notes (Signed)
Have you seen any specialists/other providers since your last visit with us?    Yes    Arm preference verified?   Yes    The patient is due for advance directive on file

## 2020-09-09 ENCOUNTER — Other Ambulatory Visit (INDEPENDENT_AMBULATORY_CARE_PROVIDER_SITE_OTHER): Payer: Self-pay | Admitting: Family

## 2020-09-09 DIAGNOSIS — E0500 Thyrotoxicosis with diffuse goiter without thyrotoxic crisis or storm: Secondary | ICD-10-CM

## 2020-09-13 ENCOUNTER — Ambulatory Visit (INDEPENDENT_AMBULATORY_CARE_PROVIDER_SITE_OTHER): Payer: TRICARE Prime—HMO | Admitting: Family

## 2020-09-20 ENCOUNTER — Other Ambulatory Visit (INDEPENDENT_AMBULATORY_CARE_PROVIDER_SITE_OTHER): Payer: Self-pay | Admitting: Family

## 2020-09-20 DIAGNOSIS — K219 Gastro-esophageal reflux disease without esophagitis: Secondary | ICD-10-CM

## 2020-10-04 ENCOUNTER — Other Ambulatory Visit (INDEPENDENT_AMBULATORY_CARE_PROVIDER_SITE_OTHER): Payer: Self-pay | Admitting: Family

## 2020-10-04 DIAGNOSIS — Z9109 Other allergy status, other than to drugs and biological substances: Secondary | ICD-10-CM

## 2020-10-08 IMAGING — DX SHOULDER RIGHT 2 VIEWS
1 series · 2 of 2 positions shown · non-contrast
Comparison: None.

FINAL Diagnostic Imaging Report 
________________________________________________________________________________________________ 
SHOULDER RIGHT 2 VIEWS, 10/08/2020 [DATE]: 
CLINICAL INDICATION: Right shoulder pain for 3 weeks.

[Series 1: AP · 0.14mm/px · 2 of 2 slices shown]
[im 1/2]
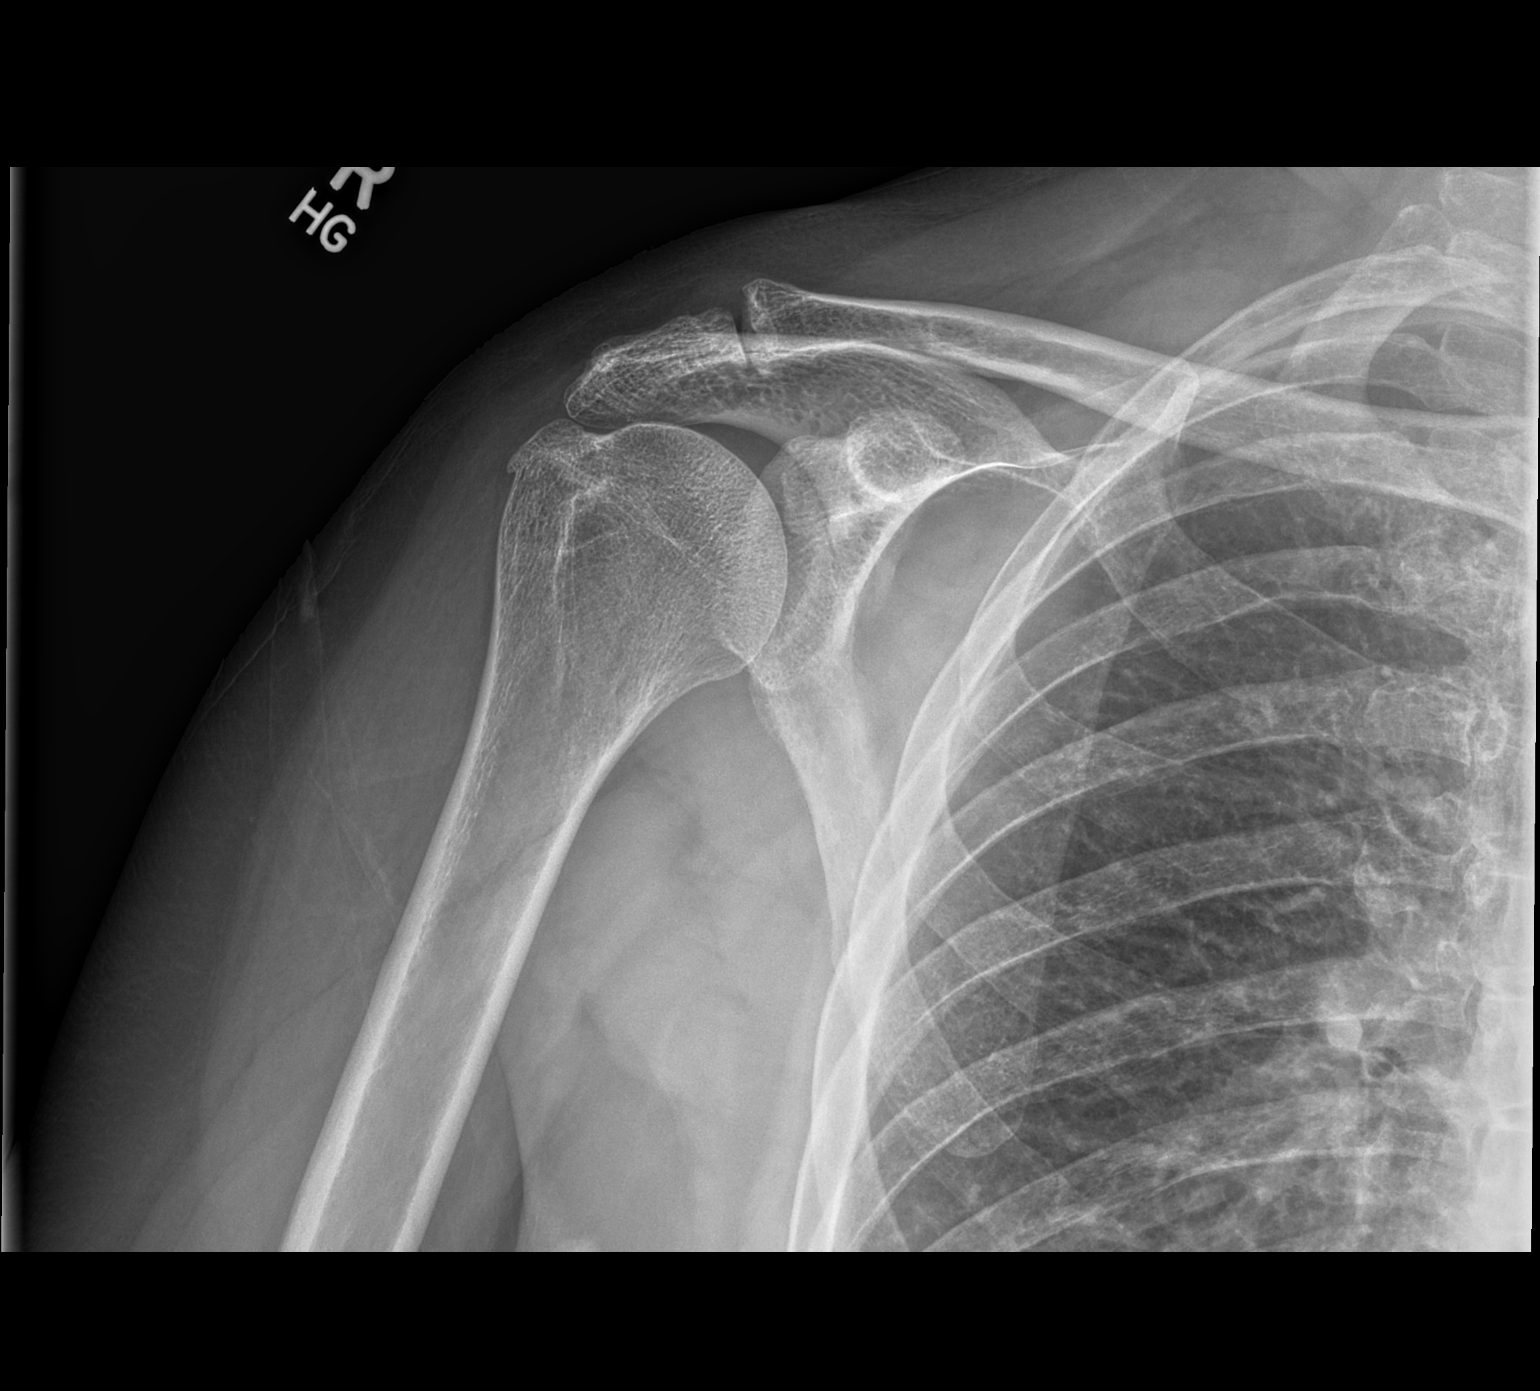
[im 2/2]
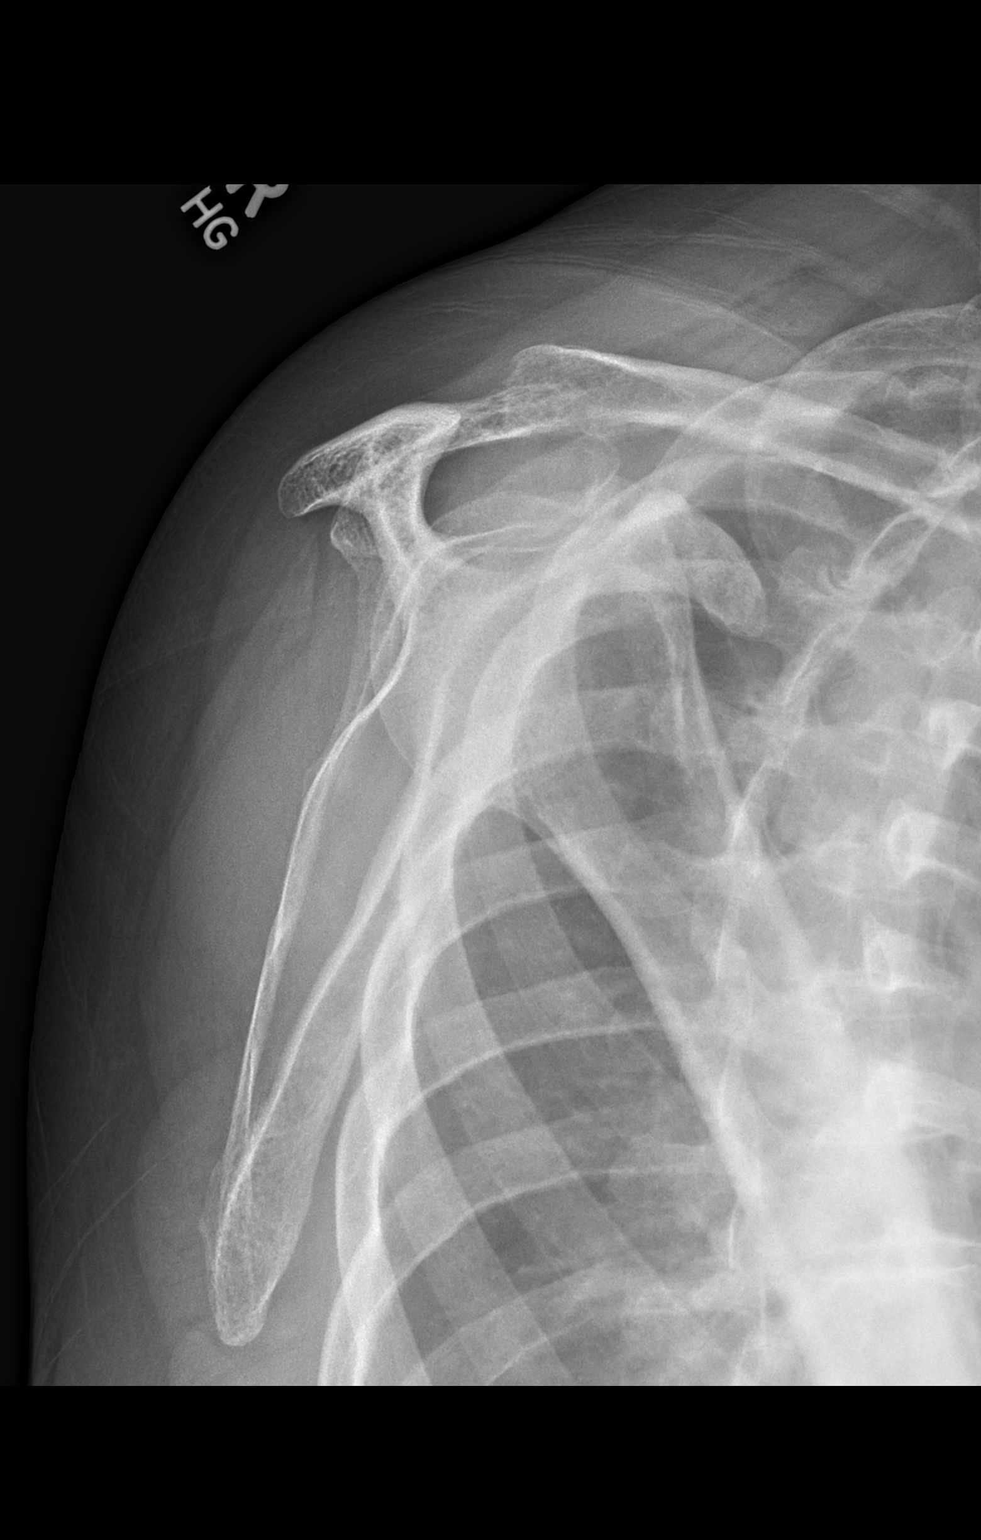

[2 of 2 positions shown; findings below may reference images not displayed]

FINDINGS: No fractures or dislocations. Mild AC joint degenerative change. 
Spurring of the greater tuberosity of the humerus. Glenohumeral joint is not 
imaged in profile.
IMPRESSION: Mild degenerative change.

## 2020-11-15 IMAGING — MR MRI RIGHT SHOULDER WITHOUT CONTRAST
4 of 6 series · 18 of 40 positions shown · IV contrast (gadolinium)
Comparison: 10/08/2020 radiographs

FINAL Diagnostic Imaging Report 
________________________________________________________________________________________________ 
MRI RIGHT SHOULDER WITHOUT CONTRAST, 11/15/2020 [DATE]: 
CLINICAL INDICATION: Right shoulder pain. Unspecified rotator cuff tear or 
rupture, right shoulder.
TECHNIQUE: Multiplanar, multiecho position MR images of the shoulder were 
performed without intravenous gadolinium enhancement. 
Patient was scanned on a 1.5T magnet.

[Series 4: PD fat-sat · axial · 4.0mm · 0.33mm/px · z∈[-43,+74]mm · 8 of 27 slices shown]
[im 1/27]
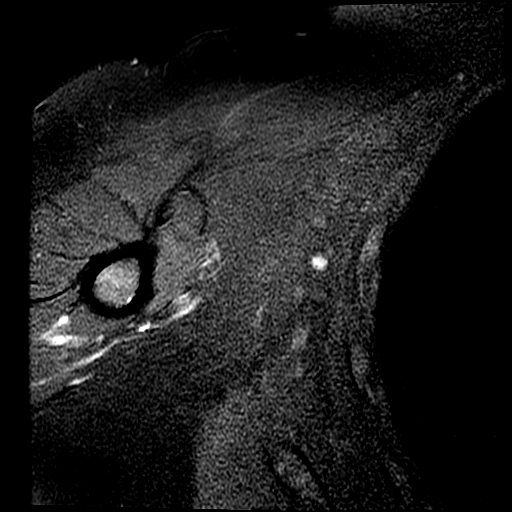
[im 4/27]
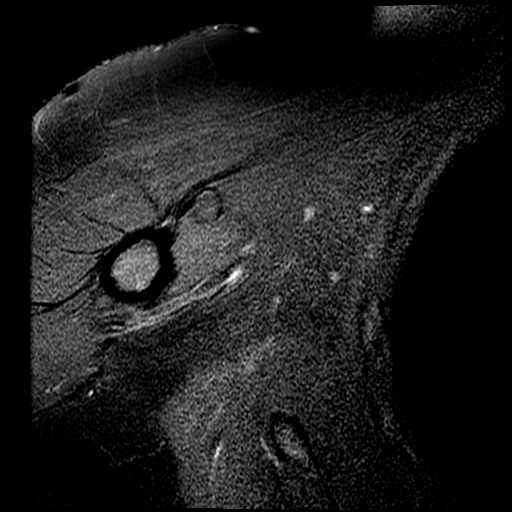
[im 8/27]
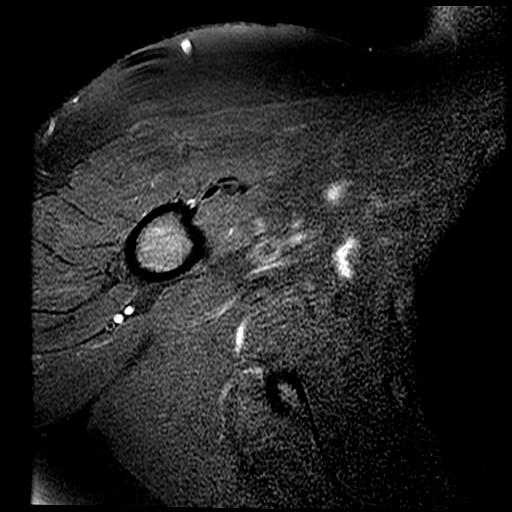
[im 12/27]
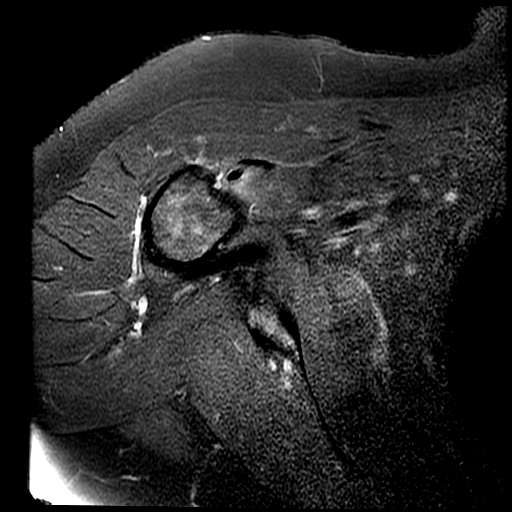
[im 15/27]
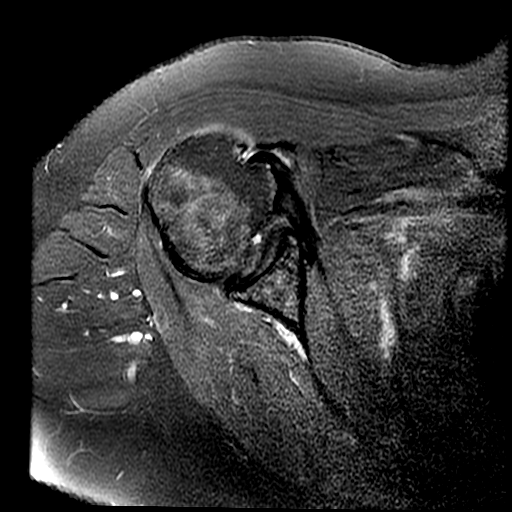
[im 19/27]
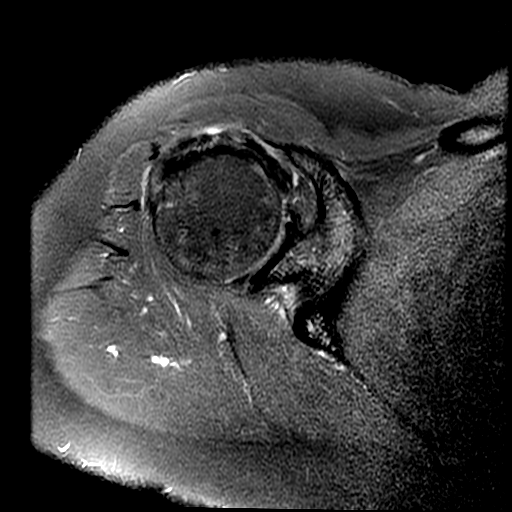
[im 23/27]
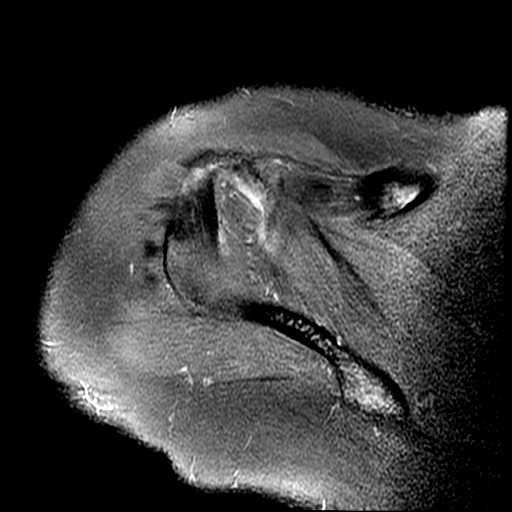
[im 27/27]
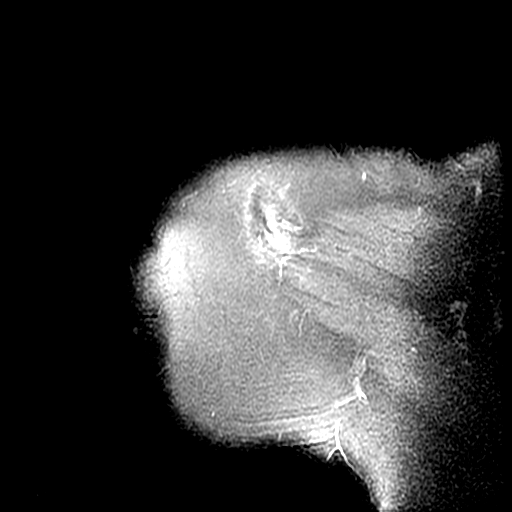

[Series 5: T2 fat-sat · coronal · 4.0mm · 0.31mm/px · 4 of 24 slices shown (1 of 2)]
[im 1/24]
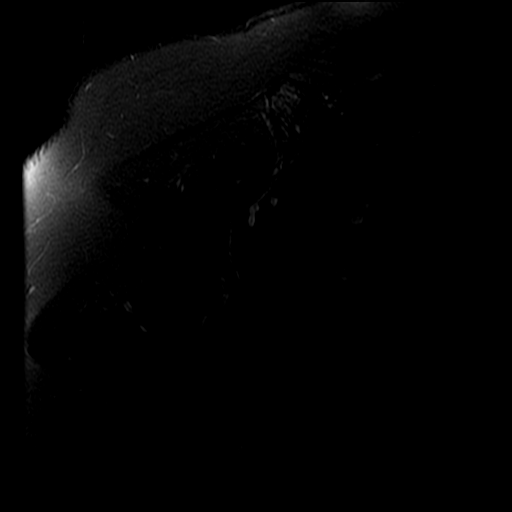
[im 4/24]
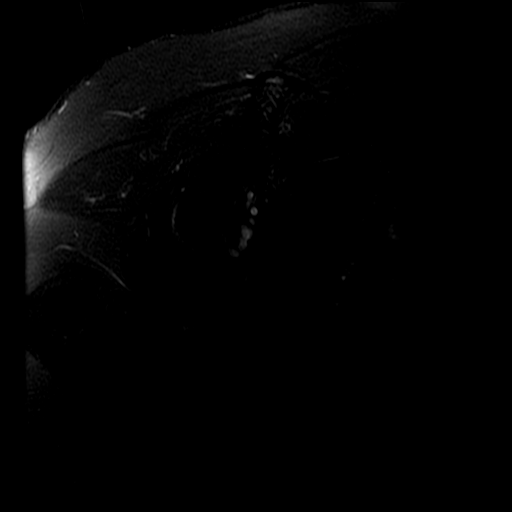
[im 12/24]
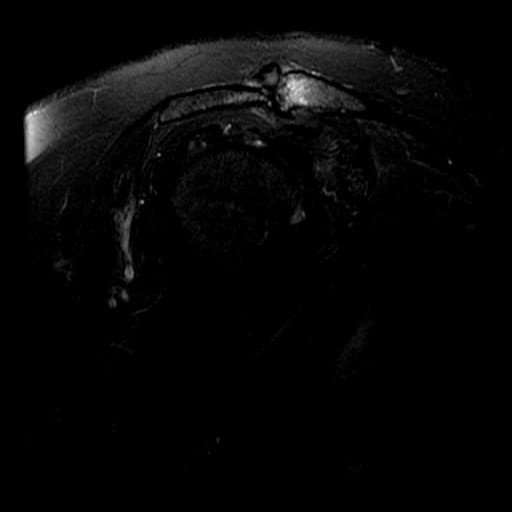
[im 20/24]
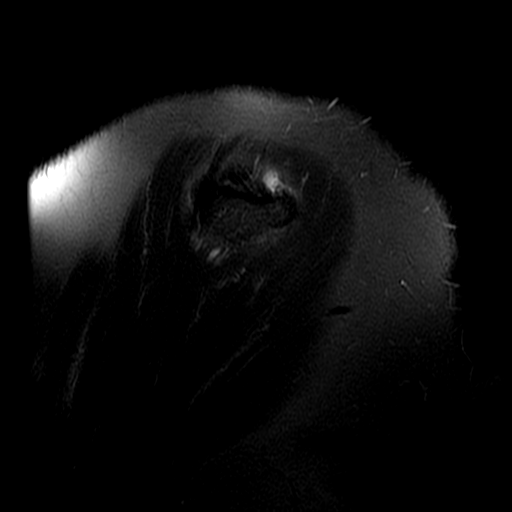

[Series 6: T1 · coronal · 4.0mm · 0.31mm/px · 3 of 24 slices shown]
[im 4/24]
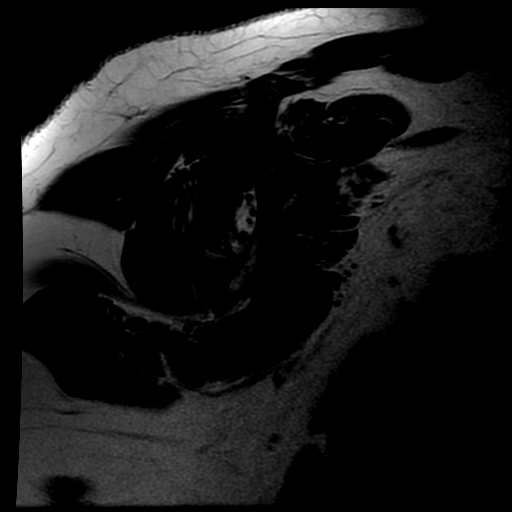
[im 12/24]
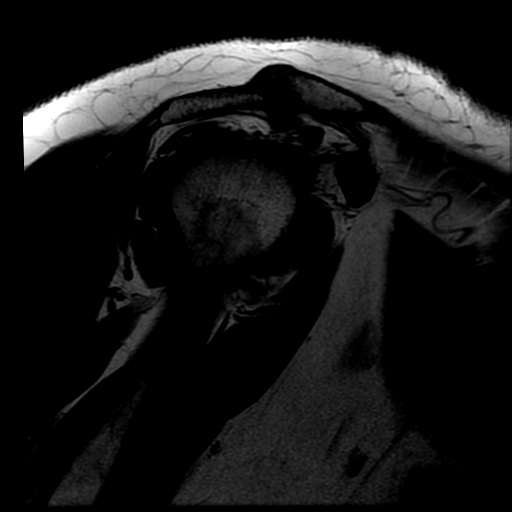
[im 20/24]
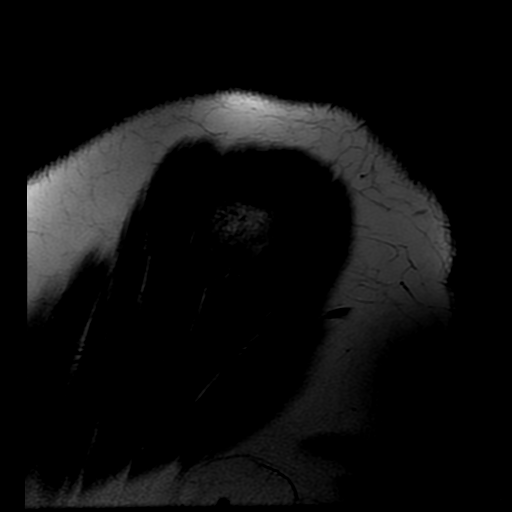

[Series 7: T2 fat-sat · oblique · 3.0mm · 0.31mm/px · 3 of 20 slices shown (2 of 2)]
[im 4/20]
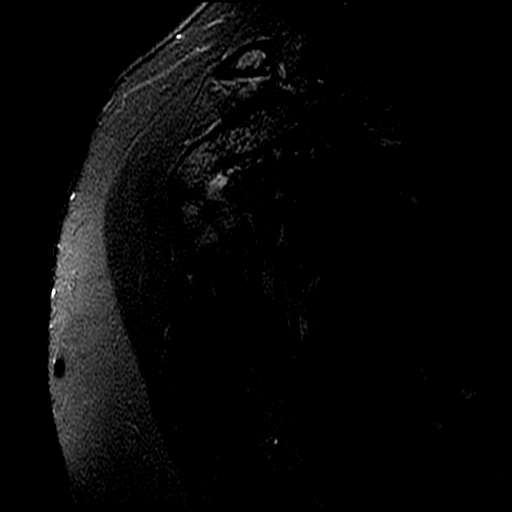
[im 12/20]
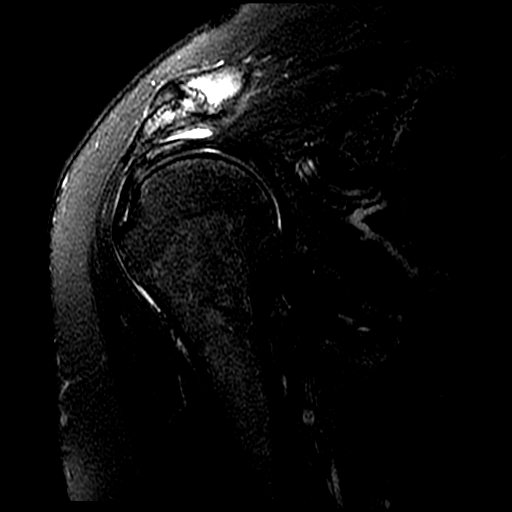
[im 20/20]
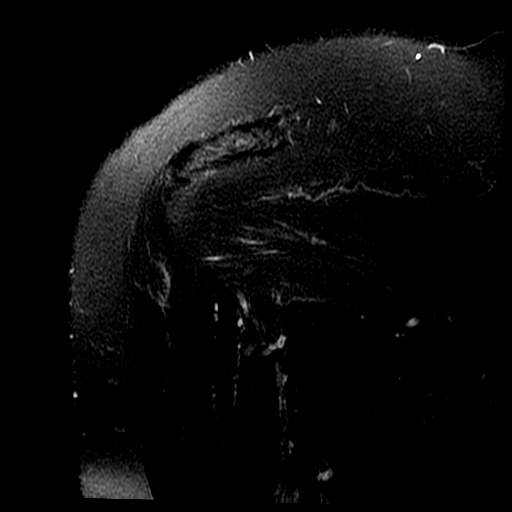

[18 of 40 positions shown; findings below may reference images not displayed]

FINDINGS: ROTATOR CUFF: Small low-grade bursal sided distal supraspinatus tendon tear 
measures 3 mm in AP dimensions. 1 mm concealed, low-grade intrasubstance distal 
supraspinatus tendon tear. The infraspinatus, subscapularis and teres minor 
tendons are intact. The rotator cuff musculature is symmetric without mass, 
signal abnormality or atrophy. 
ACROMIOCLAVICULAR JOINT: Mild degenerative change of the acromioclavicular joint 
with mild mass effect upon the supraspinatus. AC joint subcortical cystic change 
and distal clavicular marrow edema. The coracoacromial ligament is intact 
without prominent spurring at the acromial attachment. The acromioclavicular and 
coracoclavicular ligaments are preserved. The acromium is normal in morphology. 
GLENOHUMERAL JOINT: The humeral head is well located within the glenoid fossa. 
Articular cartilage is preserved.  The glenoid labrum is preserved. No 
paralabral cyst. The intra-articular portion of the long head of the biceps 
tendon is negative. No shoulder joint effusion. 
BONES: The bone marrow signal intensity is negative for fracture. No Hill-Sachs 
defect. Subcortical cystic change of the humeral head. 
ADDITIONAL FINDINGS: Small amount of subacromial/subdeltoid bursal fluid. The 
axillary region is negative. Subcutaneous tissues are negative.
IMPRESSION: 1.  3 mm low-grade bursal sided distal supraspinatus tendon tear and 1 mm 
concealed intrasubstance tear.  
2.  Mild AC joint degenerative change with mild mass effect upon the 
supraspinatus. 
3.  Small amount of subacromial/subdeltoid bursal fluid.

## 2020-12-31 ENCOUNTER — Other Ambulatory Visit (INDEPENDENT_AMBULATORY_CARE_PROVIDER_SITE_OTHER): Payer: Self-pay | Admitting: Family

## 2021-01-18 ENCOUNTER — Other Ambulatory Visit (INDEPENDENT_AMBULATORY_CARE_PROVIDER_SITE_OTHER): Payer: Self-pay | Admitting: Family

## 2021-01-18 DIAGNOSIS — N401 Enlarged prostate with lower urinary tract symptoms: Secondary | ICD-10-CM

## 2021-01-19 NOTE — Telephone Encounter (Signed)
Med last filled 08/2020 for a 6 month supply. Pt should not need refill yet.

## 2021-02-03 ENCOUNTER — Other Ambulatory Visit (INDEPENDENT_AMBULATORY_CARE_PROVIDER_SITE_OTHER): Payer: Self-pay | Admitting: Family

## 2021-02-03 DIAGNOSIS — R3911 Hesitancy of micturition: Secondary | ICD-10-CM

## 2021-02-03 DIAGNOSIS — N401 Enlarged prostate with lower urinary tract symptoms: Secondary | ICD-10-CM

## 2021-02-04 ENCOUNTER — Other Ambulatory Visit (INDEPENDENT_AMBULATORY_CARE_PROVIDER_SITE_OTHER): Payer: Self-pay | Admitting: Family

## 2021-02-04 DIAGNOSIS — K219 Gastro-esophageal reflux disease without esophagitis: Secondary | ICD-10-CM

## 2021-02-11 ENCOUNTER — Other Ambulatory Visit (INDEPENDENT_AMBULATORY_CARE_PROVIDER_SITE_OTHER): Payer: Self-pay | Admitting: Family

## 2021-02-11 DIAGNOSIS — E782 Mixed hyperlipidemia: Secondary | ICD-10-CM

## 2021-02-22 ENCOUNTER — Other Ambulatory Visit (INDEPENDENT_AMBULATORY_CARE_PROVIDER_SITE_OTHER): Payer: Self-pay | Admitting: Family

## 2021-02-22 DIAGNOSIS — K219 Gastro-esophageal reflux disease without esophagitis: Secondary | ICD-10-CM

## 2021-04-06 ENCOUNTER — Other Ambulatory Visit (INDEPENDENT_AMBULATORY_CARE_PROVIDER_SITE_OTHER): Payer: Self-pay | Admitting: Family

## 2021-04-06 DIAGNOSIS — K219 Gastro-esophageal reflux disease without esophagitis: Secondary | ICD-10-CM

## 2021-04-06 DIAGNOSIS — N401 Enlarged prostate with lower urinary tract symptoms: Secondary | ICD-10-CM

## 2021-04-16 ENCOUNTER — Other Ambulatory Visit (INDEPENDENT_AMBULATORY_CARE_PROVIDER_SITE_OTHER): Payer: Self-pay | Admitting: Family

## 2021-04-16 DIAGNOSIS — K219 Gastro-esophageal reflux disease without esophagitis: Secondary | ICD-10-CM

## 2021-11-09 IMAGING — CT CT LUNG SCREENING
2 of 4 series · 7 of 36 positions shown, 8 images · non-contrast
Comparison: None.

________________________________________________________________________________________________ 
CT LUNG SCREENING, 11/09/2021 [DATE]: 
CLINICAL INDICATION: Personal History Of Nicotine Dependence. 20 pack year 
smoking history. 
A search for DICOM formatted images was conducted for prior CT imaging studies 
completed at a non-affiliated media free facility.
TECHNIQUE: Screening computed tomographic images of the chest are performed from 
the thoracic outlet through the diaphragms without intravenous contrast 
utilizing dose reduction technique. The patients dose for this study is
mGy.

[Series 4: coronal · coronal · 0.63mm/px · 3 of 169 slices shown]
[im 34/169  lung]
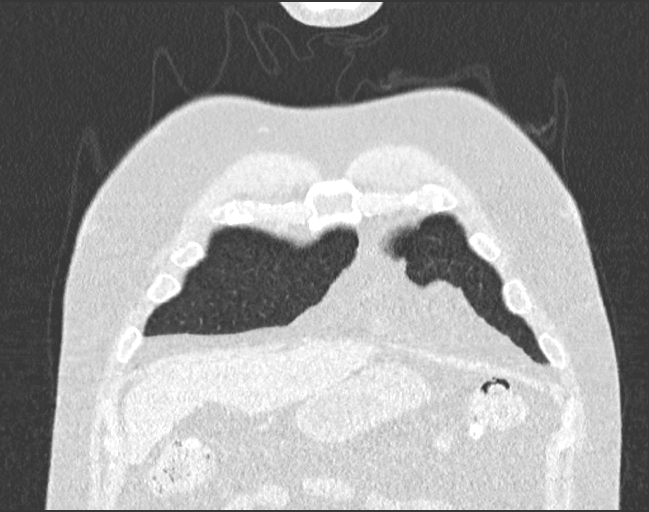
[im 68/169  lung]
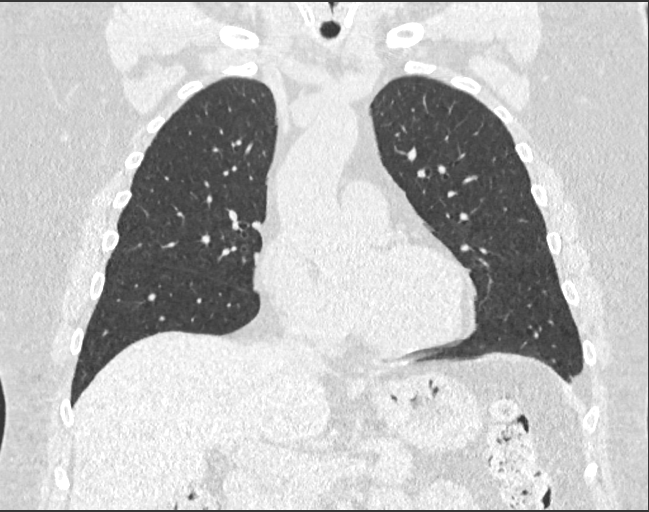
[im 101/169  lung]
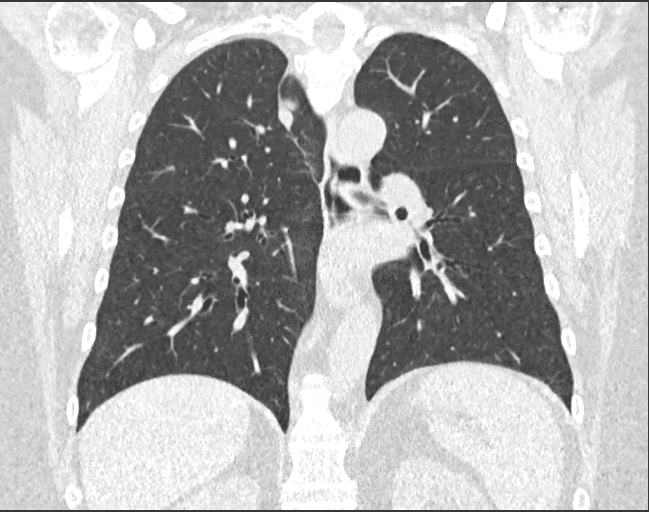

[Series 6: lung mips · axial · 0.50mm/px · z∈[-276,-113]mm · 4 of 42 slices shown, 5 images]
[im 9/42  mediastinal]
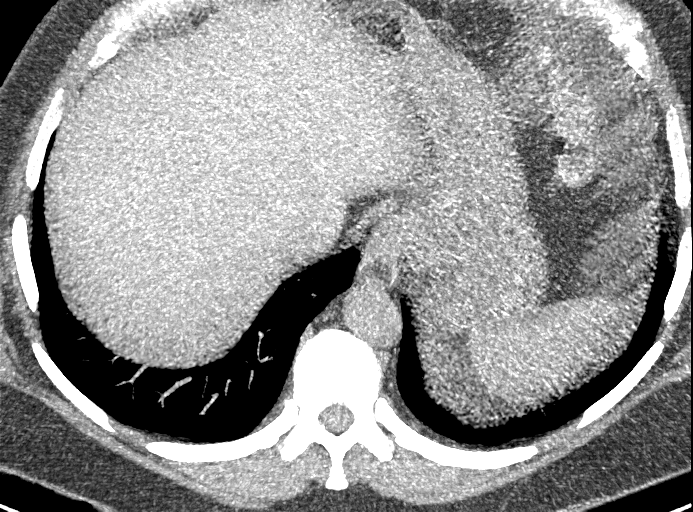
[im 9/42  lung]
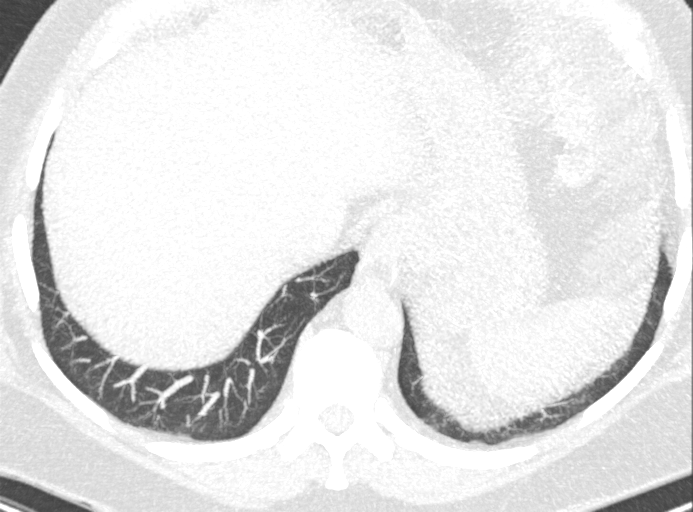
[im 17/42  lung]
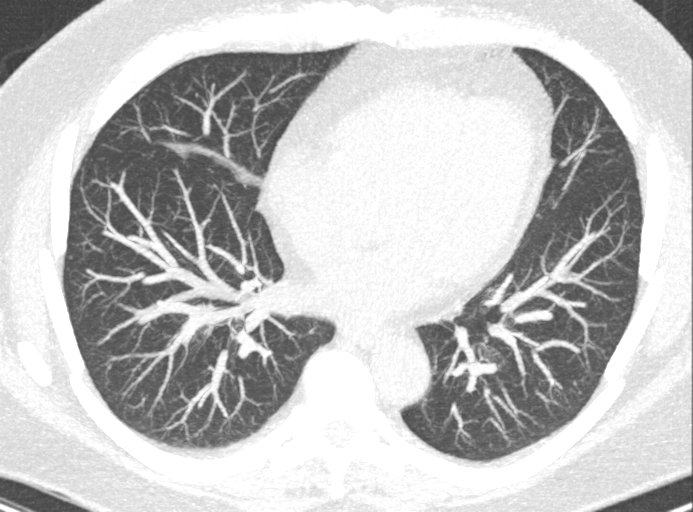
[im 25/42  lung]
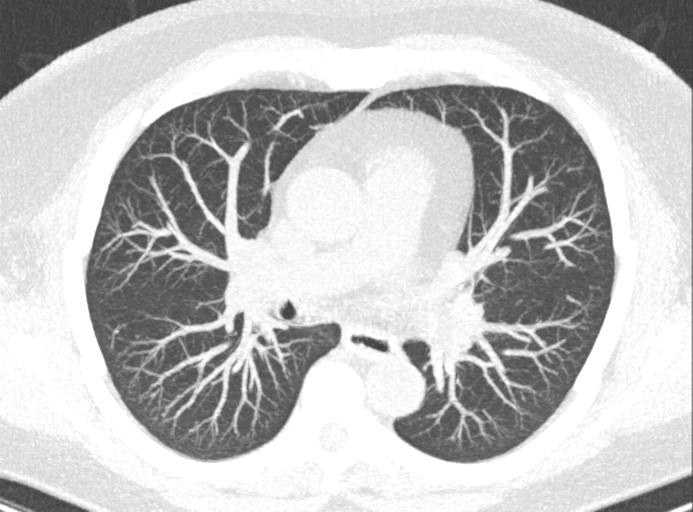
[im 33/42  lung]
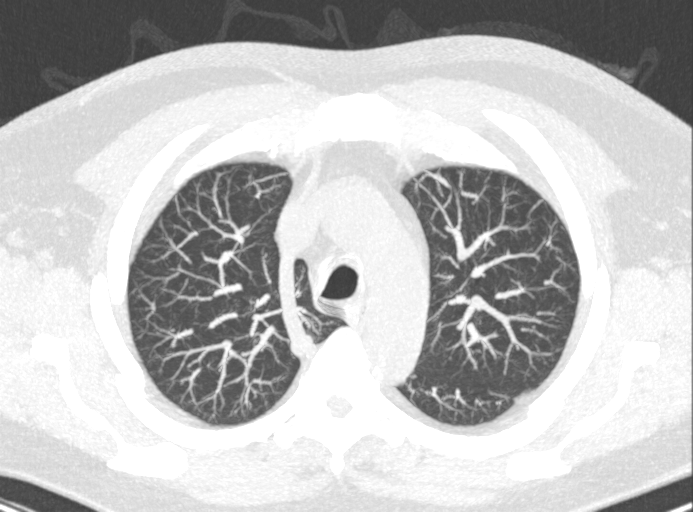

[7 of 36 positions shown; findings below may reference images not displayed]

FINDINGS: LUNGS AND PLEURA:  2 mm noncalcified nodule medial aspect right upper lobe mild 
peribronchial thickening and scattered atelectasis but no acute consolidation. 
LYMPH NODES:  No lymphadenopathy. 
MEDIASTINUM:  No mediastinal masses. 
HEART: Normal in size. No pericardial effusion. 
OSSEOUS STRUCTURES:  No acute fracture or destructive lesions. Benign-appearing 
sclerotic areas. 
UPPER ABDOMEN:  Small hiatal hernia status post cholecystectomy. Colonic 
diverticula..
IMPRESSION: 2 mm noncalcified nodule right upper lobe. 
L-RADS: Category 2 (benign appearance, <1% chance of malignancy) 
Solid nodule(s) 
< 6mm 
New nodule <4 mm 
Subsolid nodule(s) 
<6 mm on baseline screening 
Ground glass nodule(s) 
<30 mm 
Greater than or equal to 30 mm and unchanged or slowly growing 
Category 3 or 4 nodules that are unchanged for Greater than or equal to 3 months 
Recommended follow up 
Category 2: continue annual screening with LDCT 

RADIATION DOSE REDUCTION: All CT scans are performed using radiation dose 
reduction techniques, when applicable.  Technical factors are evaluated and 
adjusted to ensure appropriate moderation of exposure.  Automated dose 
management technology is applied to adjust the radiation doses to minimize 
exposure while achieving diagnostic quality images.

## 2022-09-19 IMAGING — MR MRI CERVICAL SPINE WITHOUT CONTRAST
5 of 9 series · 20 of 48 positions shown · IV contrast (gadolinium)
Comparison: None

________________________________________________________________________________________________ 
MRI CERVICAL SPINE WITHOUT CONTRAST, 09/19/2022 [DATE]: 
CLINICAL INDICATION: Chronic neck pain radiates to the right shoulder.
TECHNIQUE: Multiplanar, multiecho position MR images of the cervical spine were 
performed without intravenous gadolinium enhancement. Patient was scanned on a 
1.5T magnet.

[Series 101: survey · axial · 10.0mm · 1.25mm/px · z∈[-6,+200]mm · 2 of 10 slices shown]
[im 1/10]
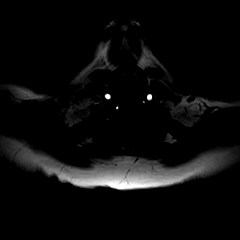
[im 10/10]
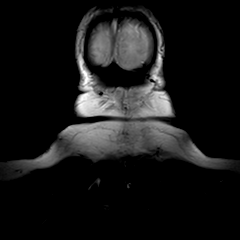

[Series 201: t2w_cor-surv · coronal · 5.0mm · 0.69mm/px · 1 of 6 slices shown]
[im 1/6]
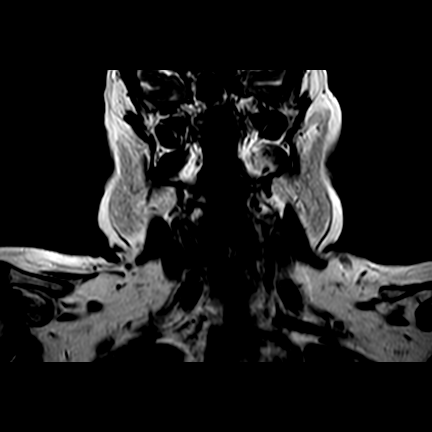

[Series 301: T1 · sagittal · 3.0mm · 0.39mm/px · 5 of 15 slices shown]
[im 1/15]
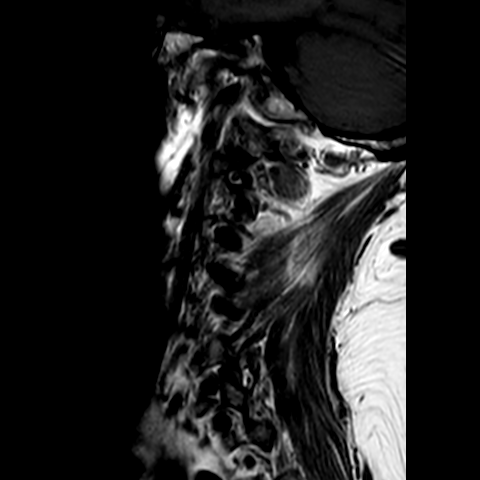
[im 4/15]
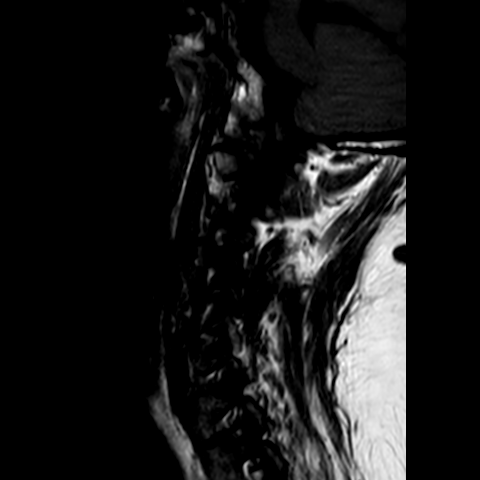
[im 8/15]
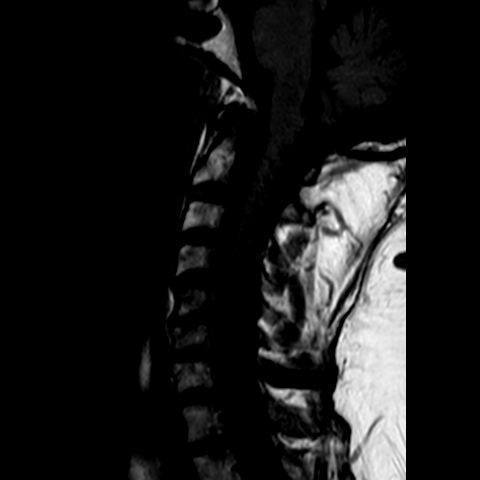
[im 11/15]
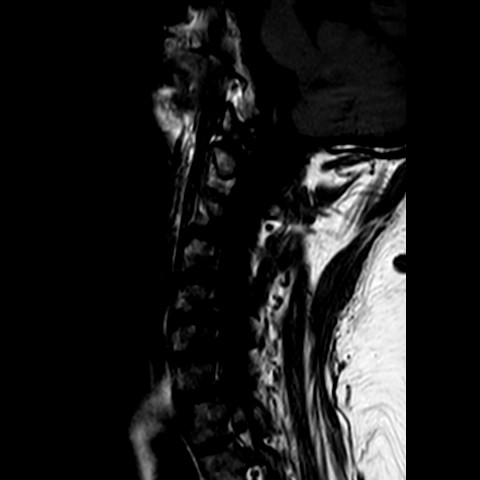
[im 15/15]
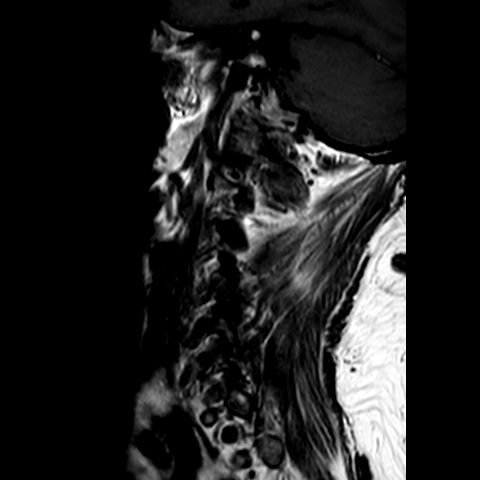

[Series 402: (id)_mdixon_tse · sagittal · 3.0mm · 0.35mm/px · 4 of 15 slices shown]
[im 1/15]
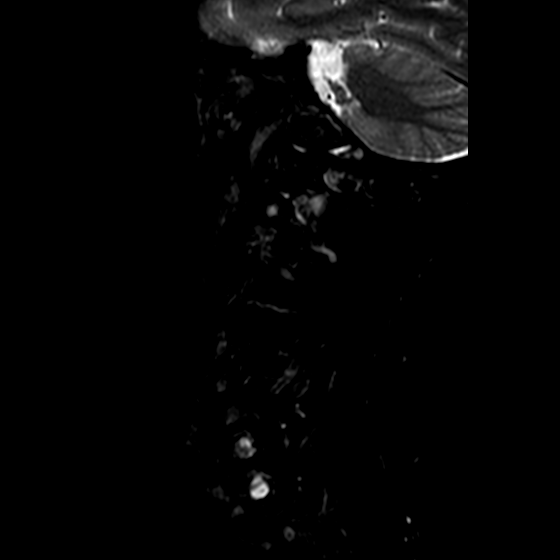
[im 4/15]
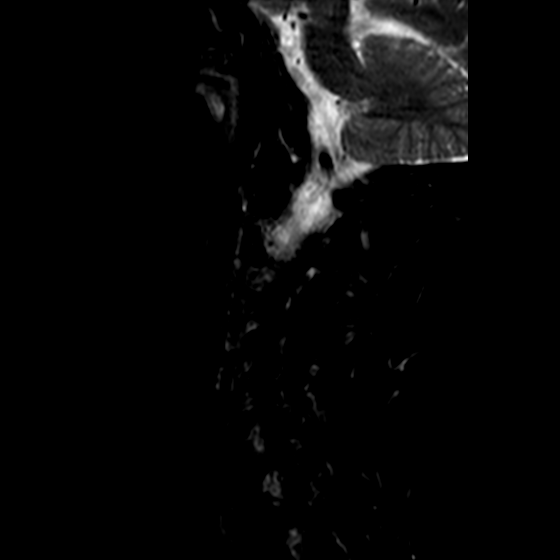
[im 8/15]
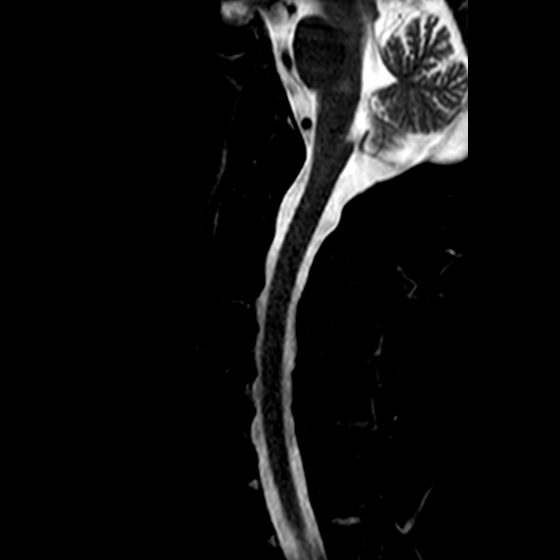
[im 11/15]
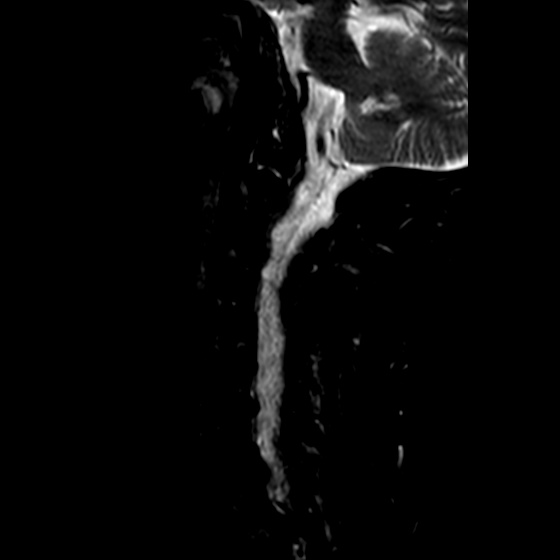

[Series 601: T2 · axial · 3.0mm · 0.31mm/px · z∈[-67,+32]mm · 8 of 34 slices shown]
[im 1/34]
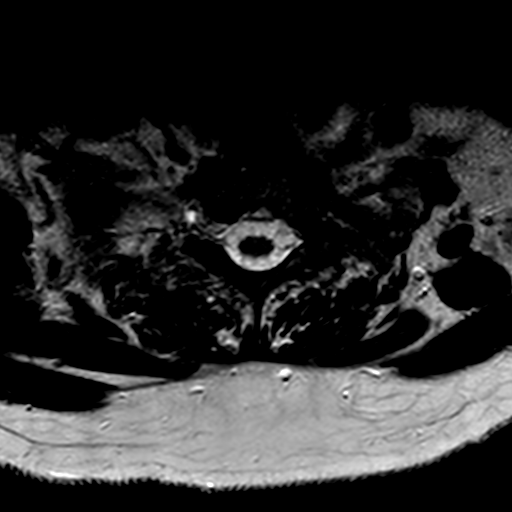
[im 4/34]
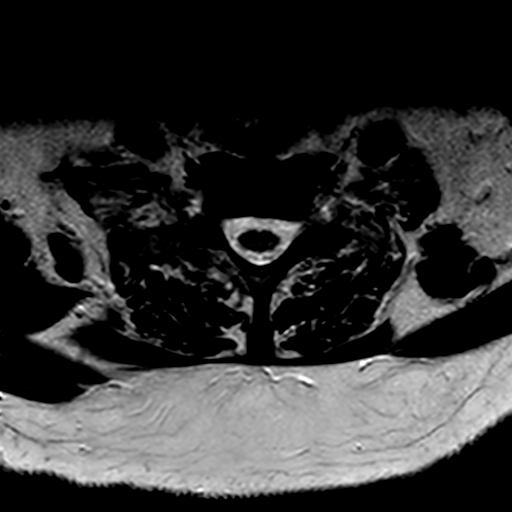
[im 12/34]
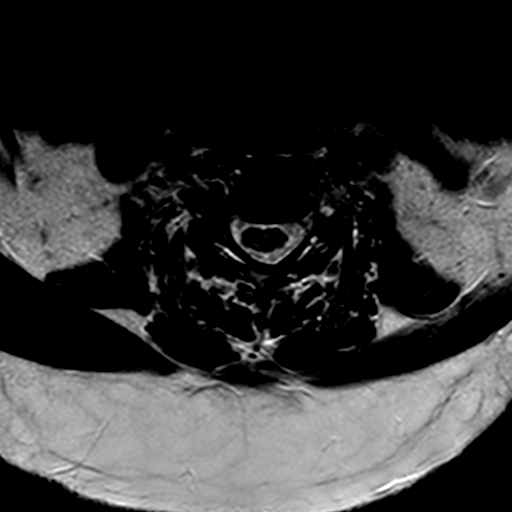
[im 15/34]
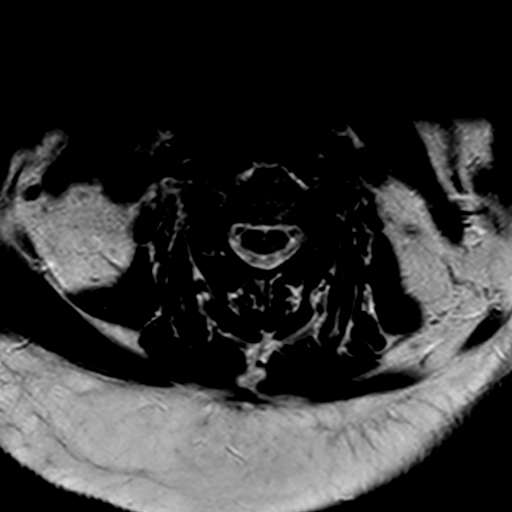
[im 19/34]
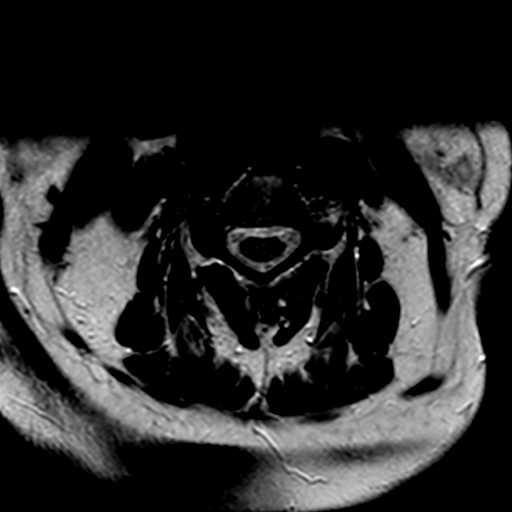
[im 23/34]
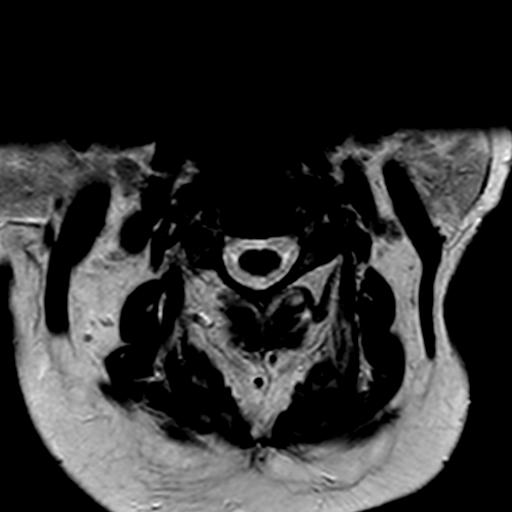
[im 30/34]
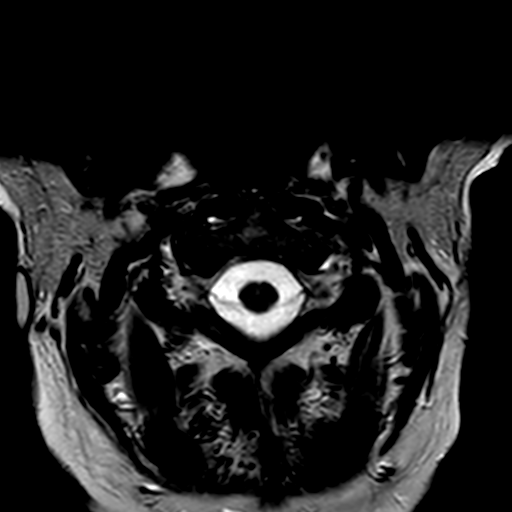
[im 34/34]
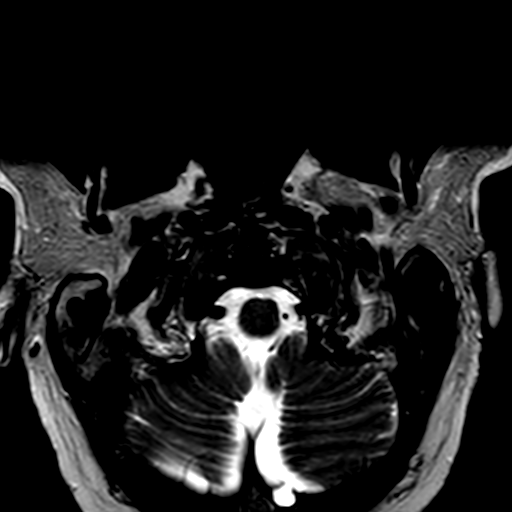

[20 of 48 positions shown; findings below may reference images not displayed]

FINDINGS: -------------------------------------------------------------------------------- 
----------------- 
GENERAL: 
ALIGNMENT: Mild levoconvex cervical scoliosis. Trace grade 1 retrolisthesis C5 
on C6. Otherwise there is anatomic sagittal alignment. 
VERTEBRAL BODY HEIGHT: Normal.  
MARROW SIGNAL: No focal suspect signal abnormality. 
CORD SIGNAL: Subtle 4 mm focus of hyperintense signal in the superior aspect of 
the medulla, image 8 series 402, nonspecific. No other abnormal cord signal. 
ADDITIONAL FINDINGS: None. 
-------------------------------------------------------------------------------- 
---------------- 
SEGMENTAL: 
CRANIOCERVICAL JUNCTION: No significant stenosis. 
C2-C3: Normal disc height. No herniation. Normal facets. No spinal canal or 
neural foraminal stenosis. 
C3-C4: Normal disc height. Mild disc osteophyte complex without significant 
canal stenosis. Foramina are patent bilaterally. Mild facet arthropathy. 
C4-C5: Normal disc height with mild disc osteophyte complex slightly eccentric 
to the right. Foramina patent. Mild right-sided facet arthropathy. 
C5-C6: Mild to moderate loss of disc height. Minimal generalized disc osteophyte 
complex. Canal patent. Left foramen patent. Mild right foraminal narrowing 
related to uncinate spurring. Right-sided facet arthropathy. 
C6-C7: Mild loss of disc height. Minimal generalized disc osteophyte complex is 
somewhat eccentric to the left. Borderline canal stenosis. Moderate to severe 
left foraminal narrowing. Mild to moderate right foraminal narrowing. Bilateral 
uncinate spurring. Normal facets. 
C7-T1: Normal disc height. No herniation. Normal facets. No spinal canal or 
neural foraminal stenosis. 
Visualized upper thoracic segments unremarkable. 
-------------------------------------------------------------------------------- 
---------------
IMPRESSION: Subtle 4 mm focus of hyperintense T2 signal in the superior aspect of the 
medulla, nonspecific and indeterminate. Suggest patient return for short-term 
follow-up brain MRI without and with gadolinium. No other abnormal cord signal. 
Cervical spondylosis. No critical or significant central canal narrowing with 
variable foraminal narrowing detailed above. 
Findings will be phoned to the referring clinicians office. 
Exam was reviewed and read in conjunction with Dr. Danii.

## 2022-11-13 IMAGING — CT CT LUNG SCREENING
2 of 4 series · 6 of 36 positions shown, 7 images · non-contrast
Comparison: CT scan 11/09/2021   
Count of known CT and Cardiac Nuclear Medicine studies performed in the previous 
12 months = 0.

________________________________________________________________________________________________ 
CT LUNG SCREENING, 11/13/2022 [DATE]: 
CLINICAL INDICATION: History of smoking 
A search for DICOM formatted images was conducted for prior CT imaging studies 
completed at a non-affiliated media free facility.
TECHNIQUE: The chest was scanned from base of neck through the lung bases 
without contrast on a high resolution low dose CT scanner. Routine MPR and MIP 
reconstruction images were performed.

[Series 4: coronal · coronal · 0.70mm/px · 3 of 153 slices shown]
[im 31/153  lung]
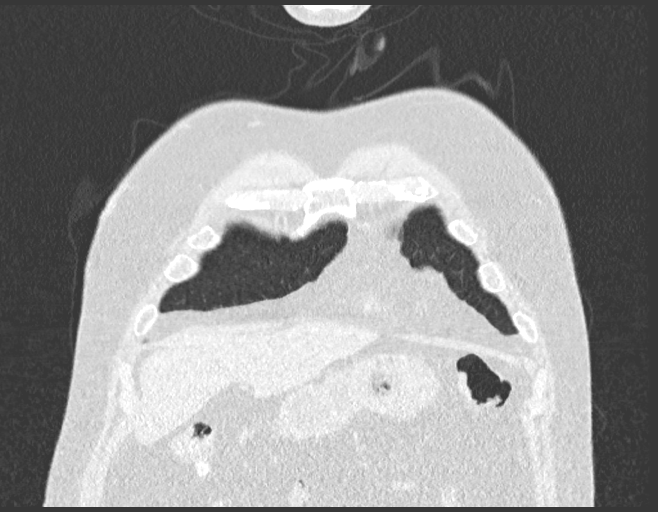
[im 61/153  lung]
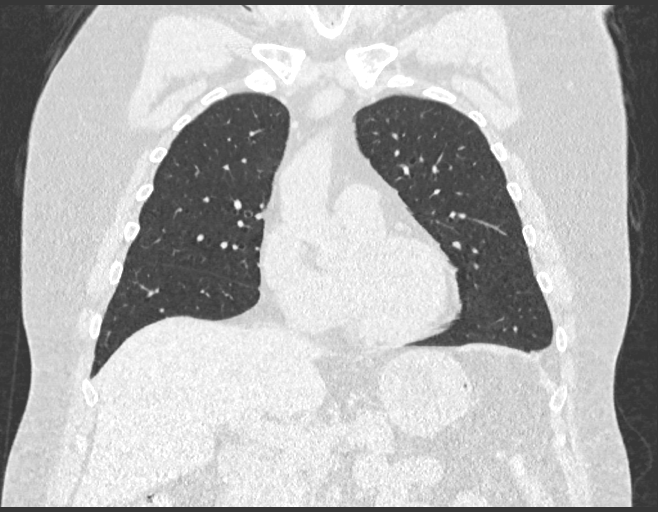
[im 92/153  lung]
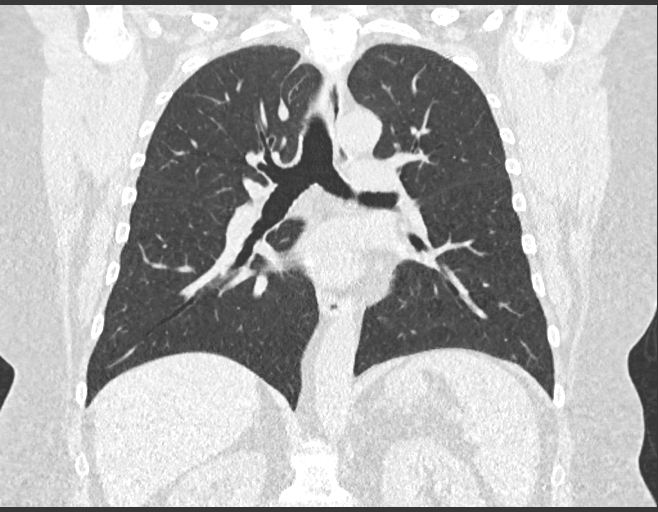

[Series 6: lung mips · axial · 0.60mm/px · z∈[-260,-108]mm · 3 of 40 slices shown, 4 images]
[im 10/40  mediastinal]
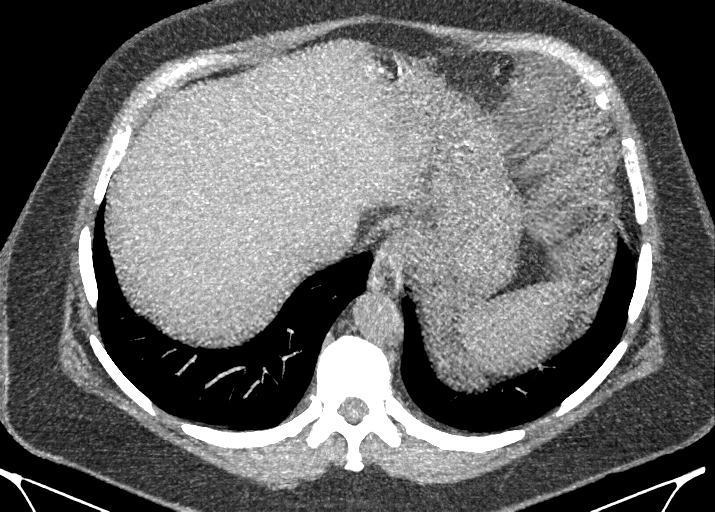
[im 10/40  lung]
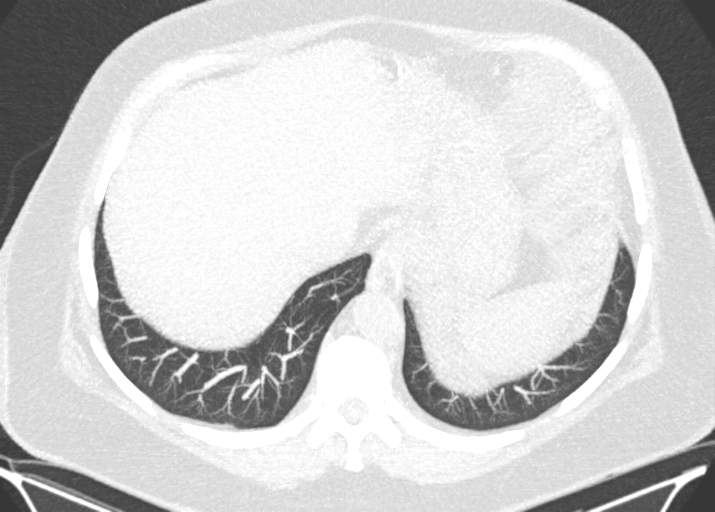
[im 20/40  lung]
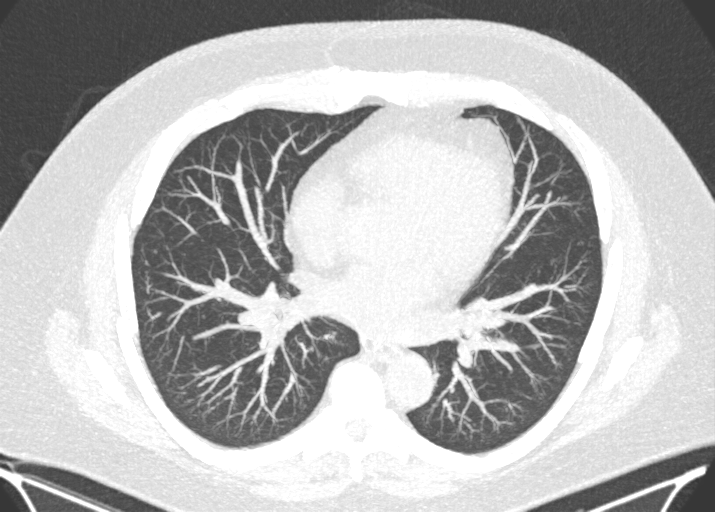
[im 30/40  lung]
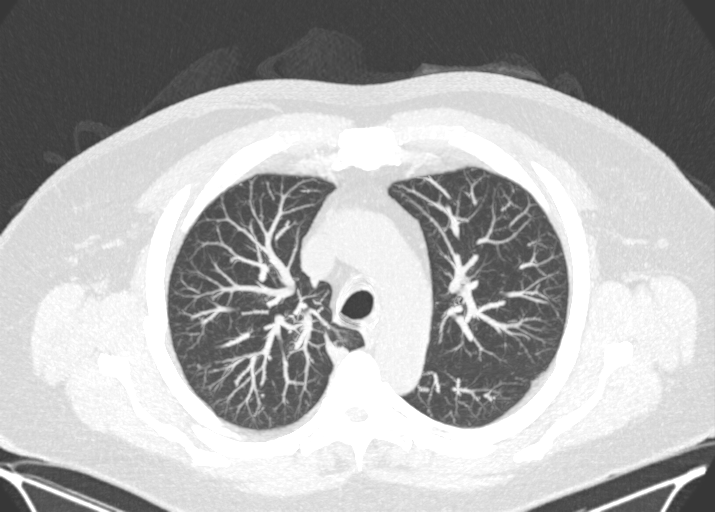

[6 of 36 positions shown; findings below may reference images not displayed]

FINDINGS: LUNGS AND PLEURA:  Again note is made of the 2 mm noncalcified nodule right 
upper lobe best seen on MIPS image 11. No new mass identified. No pleural 
effusion seen. 
MEDIASTINUM:  No adenopathy. Normal heart size. No pericardial effusion. Mild to 
moderate coronary calcifications. 
CHEST WALL/AXILLA: No mass or adenopathy.  
UPPER ABDOMEN: Previous cholecystectomy. 
MUSCULOSKELETAL: No acute abnormality.
IMPRESSION: Stable 2 mm nodule right upper lobe. No new mass seen. 
L-RADS: Category 2 (benign appearance, <1% chance of malignancy) 
Solid nodule(s) 
< 6mm 
New nodule <4 mm 
Subsolid nodule(s) 
<6 mm on baseline screening 
Ground glass nodule(s) 
<30 mm 
Greater than or equal to 30 mm and unchanged or slowly growing 
Category 3 or 4 nodules that are unchanged for Greater than or equal to 3 months 
Recommended follow up 
Category 2: continue annual screening with LDCT 

RADIATION DOSE REDUCTION: All CT scans are performed using radiation dose 
reduction techniques, when applicable.  Technical factors are evaluated and 
adjusted to ensure appropriate moderation of exposure.  Automated dose 
management technology is applied to adjust the radiation doses to minimize 
exposure while achieving diagnostic quality images.

## 2022-11-29 IMAGING — MR MRI BRAIN W/WO CONTRAST
2 of 17 series · 5 of 48 positions shown · IV contrast (Gadolinium)
Comparison: MRI cervical spine September 19, 2022.

________________________________________________________________________________________________ 
MRI BRAIN W/WO CONTRAST, 11/29/2022 [DATE]: 
CLINICAL INDICATION: Follow-up to MRI cervical spine September 19, 2022.
TECHNIQUE: Multiplanar, multiecho position MR images of the brain were performed 
without and with 10 mL of Gadavist were injected intravenously by hand. Patient 
was scanned on a 1.5T magnet.

[Series 1002: T1 post-contrast · coronal · 1.0mm · 0.24mm/px · 3 of 180 slices shown (1 of 2)]
[im 30/180]
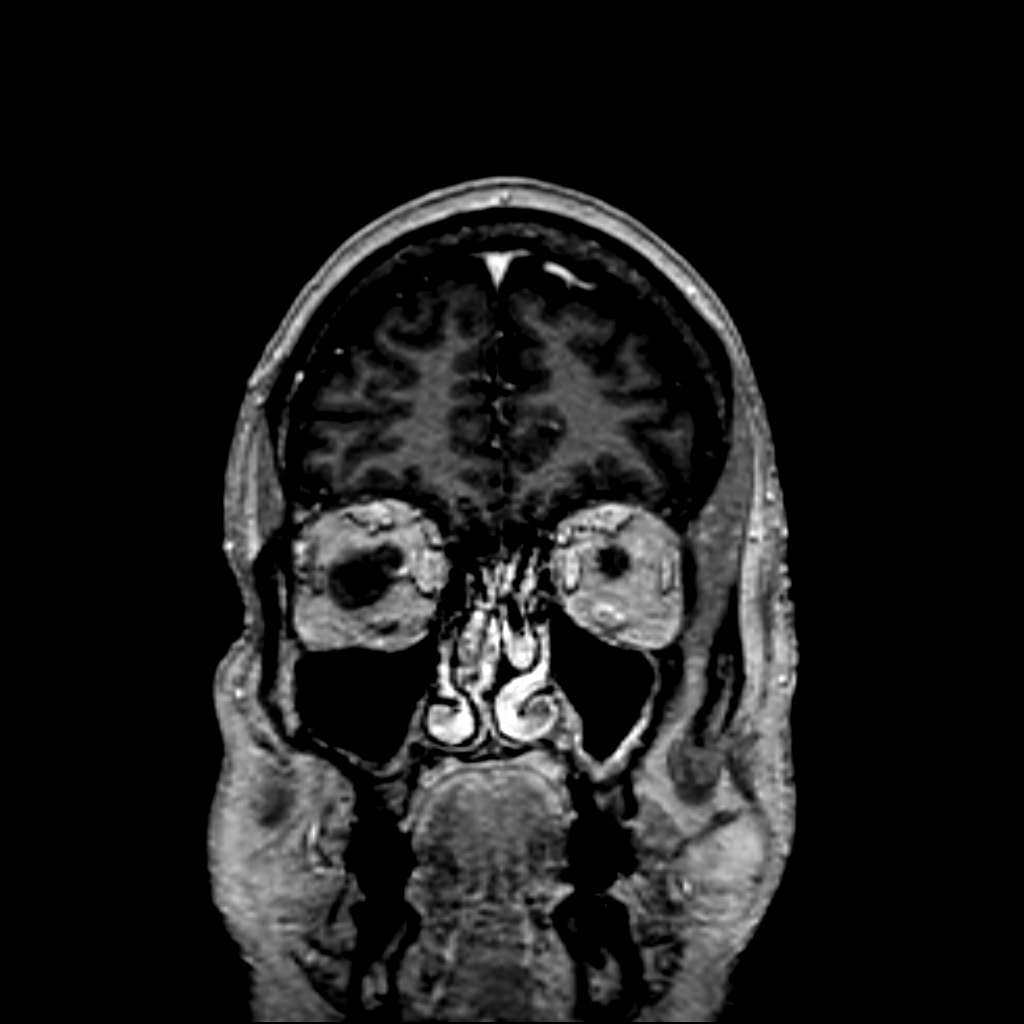
[im 90/180]
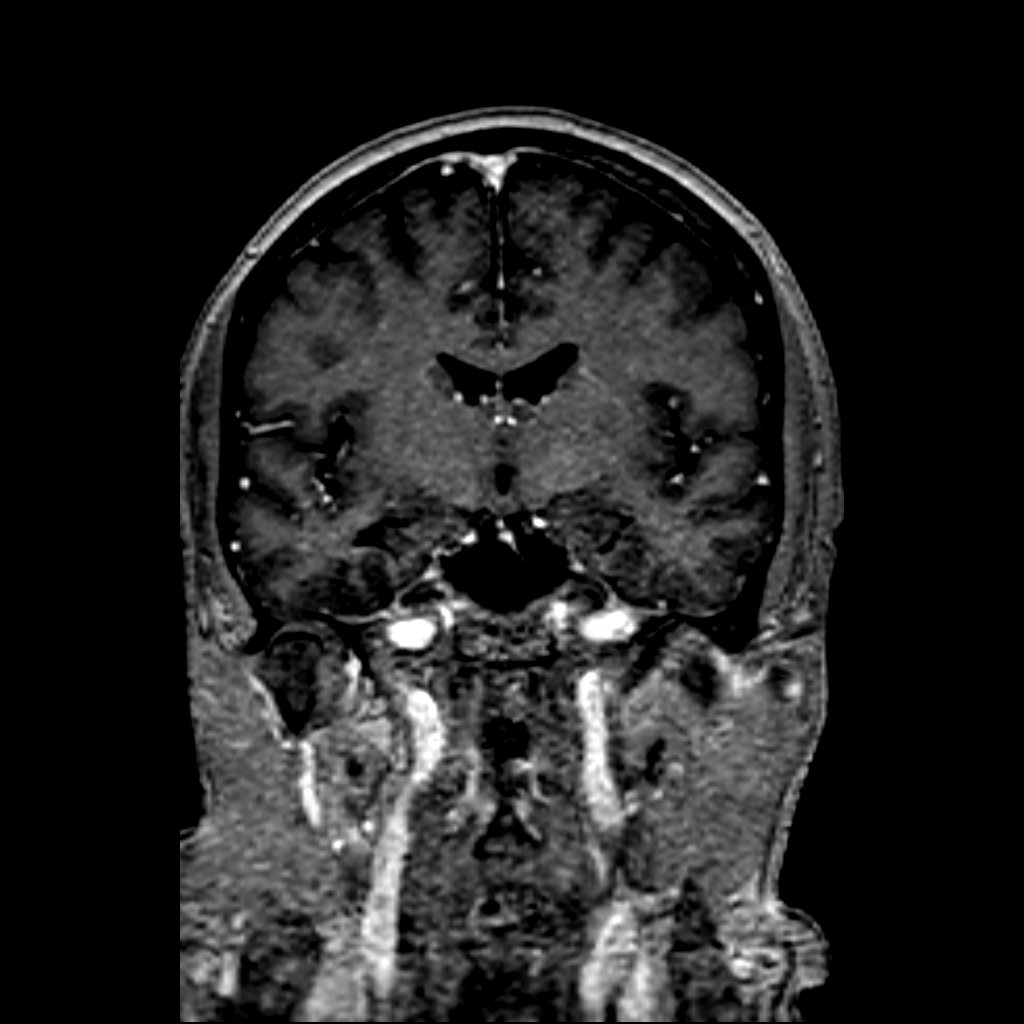
[im 150/180]
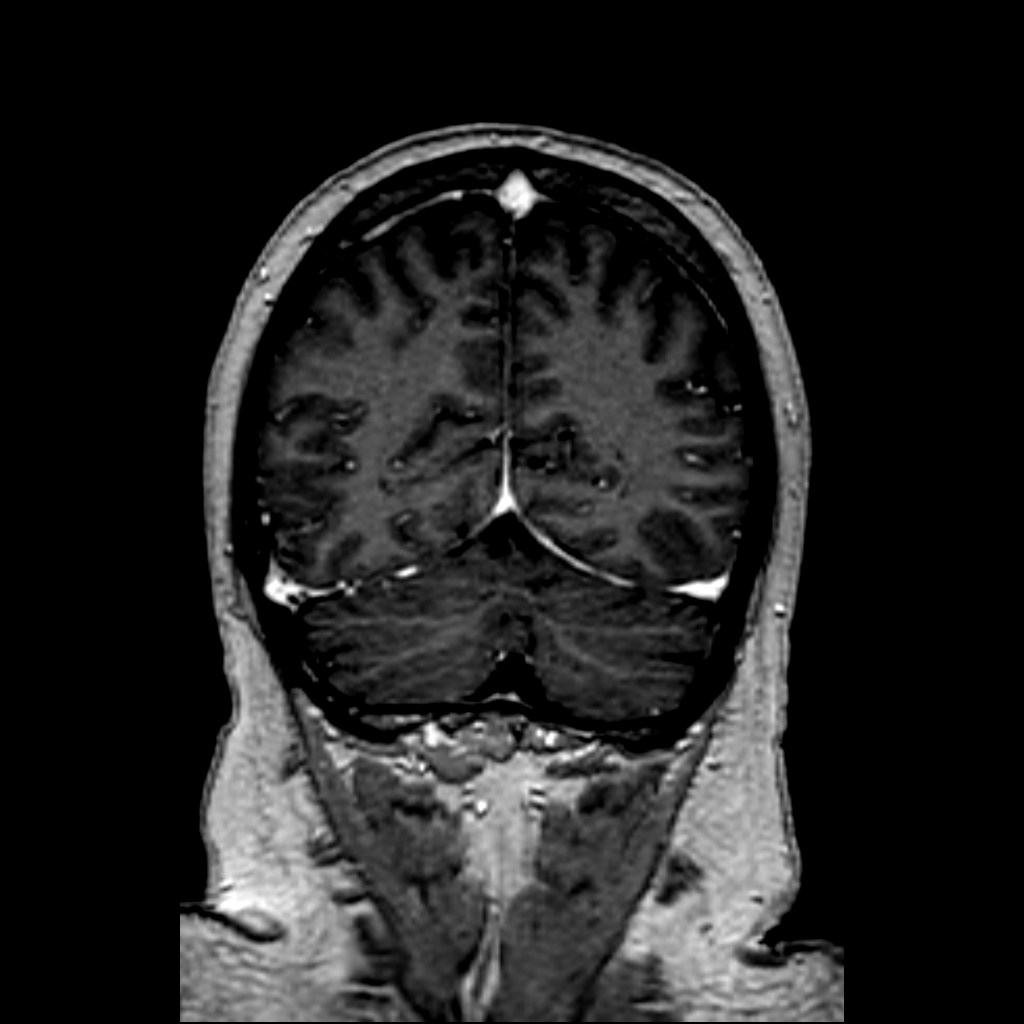

[Series 1003: T1 post-contrast · axial · 1.0mm · 0.24mm/px · z∈[-45,+9]mm · 2 of 170 slices shown (2 of 2)]
[im 29/170]
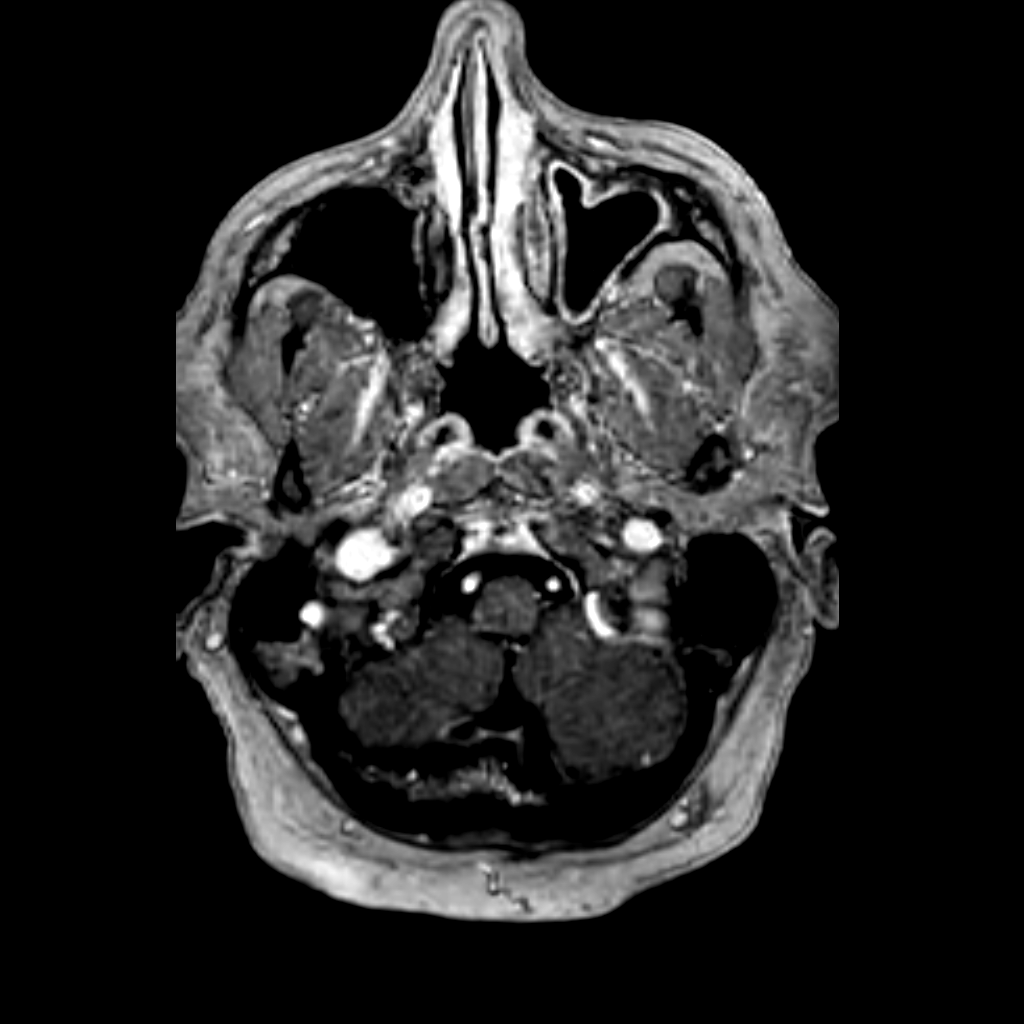
[im 85/170]
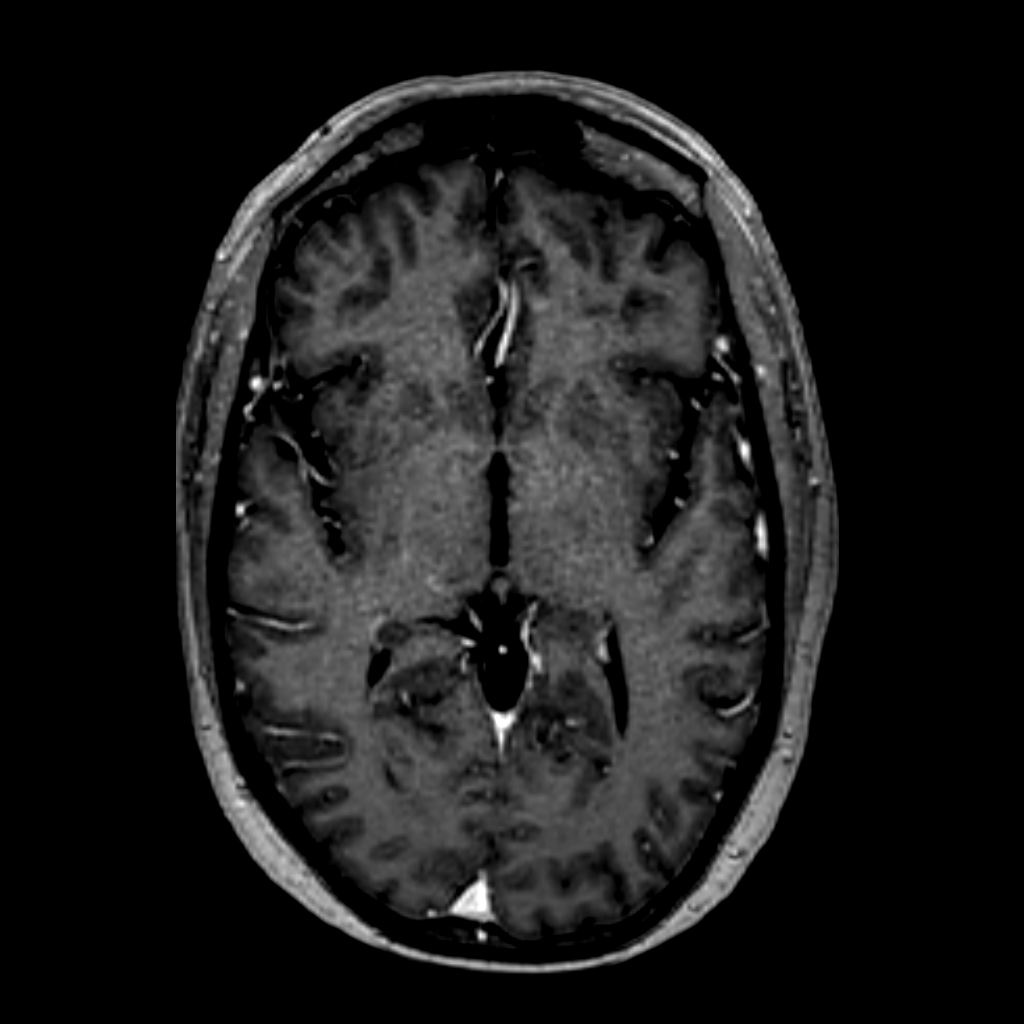

[5 of 48 positions shown; findings below may reference images not displayed]

FINDINGS: -------------------------------------------------------------------------------- 
------------------------- 
INTRACRANIAL: 
The previously noted focus of signal within the superior aspect of the medulla 
is not currently identified on todays study and may have been artifactual on 
the previous study. Minimal nonspecific periventricular T2 FLAIR hyperintensity. 
Otherwise brain parenchymal signal is normal for age. There is a 1.5 x 0.7 cm 
left posterior fossa arachnoid cyst along the medial aspect of the left 
cerebellar hemisphere. No other mass or other abnormal extra-axial fluid 
collection. 
No acute ischemia. No abnormal foci of susceptibility artifact in the brain. 
Patency of the intracranial vascular flow voids.  No acute intracranial 
hemorrhage, mass effect, midline shift. No large sellar mass. No hydrocephalus. 
Cerebral volume is age appropriate.  No pathologic contrast enhancement.  
-------------------------------------------------------------------------------- 
----------------------- 
OTHER: 
ORBITS/SINUSES/T-BONES:  Bilateral pseudophakia.  Mastoid air cells and middle 
ear cavities are grossly clear.  Acute left maxillary sinusitis with air-fluid 
level and frothy secretions. Mild ethmoid membrane thickening. 
MARROW SIGNAL/SOFT TISSUES: No focal suspect signal abnormality. 
-------------------------------------------------------------------------------- 
-------------------
IMPRESSION: Previously noted abnormal signal in the medulla is not identified on todays 
study and may have been artifactual. There is no evidence of abnormal 
enhancement within the brain parenchyma with specific attention to the medulla. 
Left posterior fossa arachnoid cyst without acute intracranial process. 
Evidence of acute left maxillary sinusitis. Mild ethmoid membrane thickening.

## 2023-05-01 IMAGING — DX SHOULDER LEFT 2 VIEWS
1 series · 2 of 2 positions shown · non-contrast
Comparison: None.

________________________________________________________________________________________________ 
SHOULDER LEFT 2 VIEWS, 05/01/2023 [DATE]: 
CLINICAL INDICATION: Pain In Left Shoulder

[Series 1: AP · 0.14mm/px · 2 of 2 slices shown]
[im 1/2]
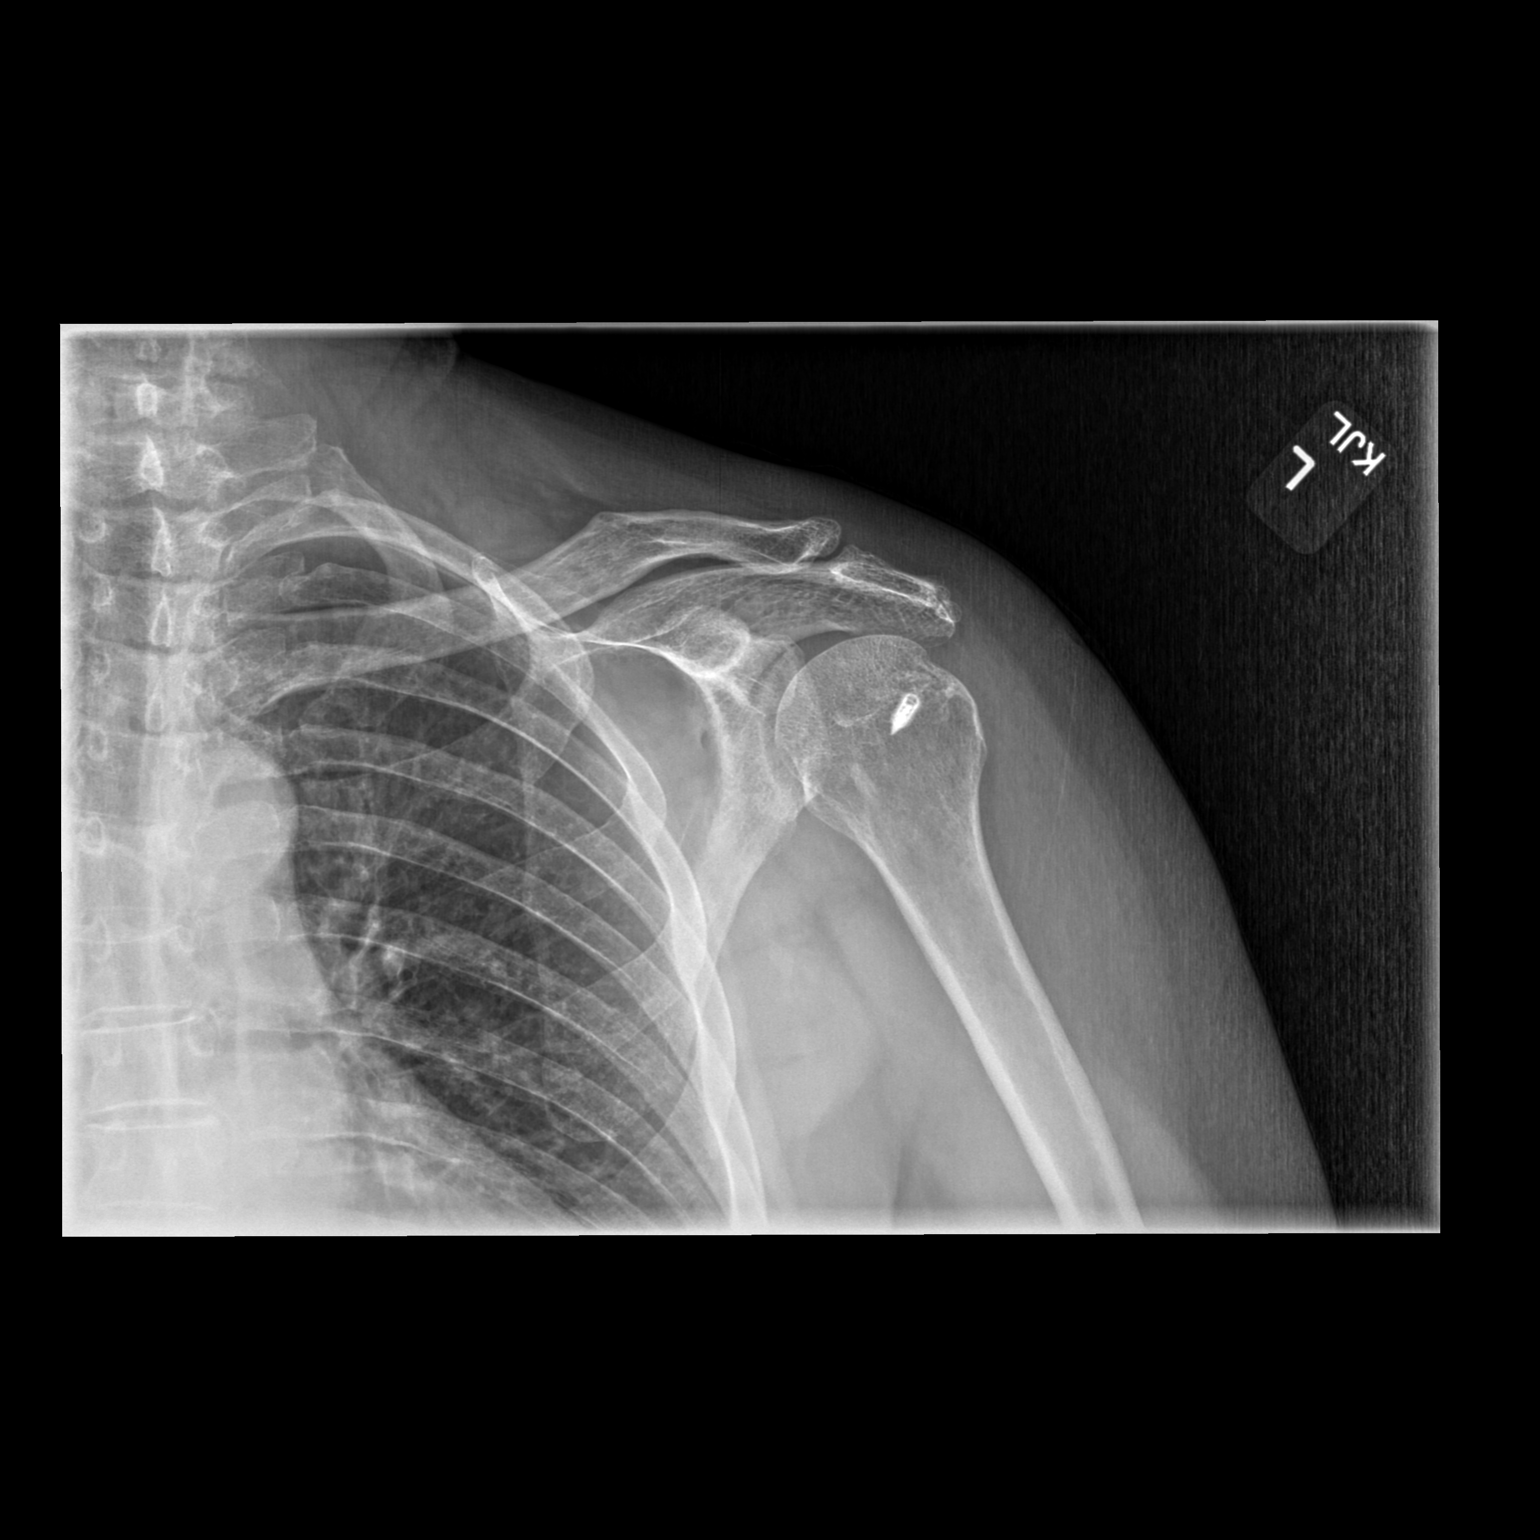
[im 2/2]
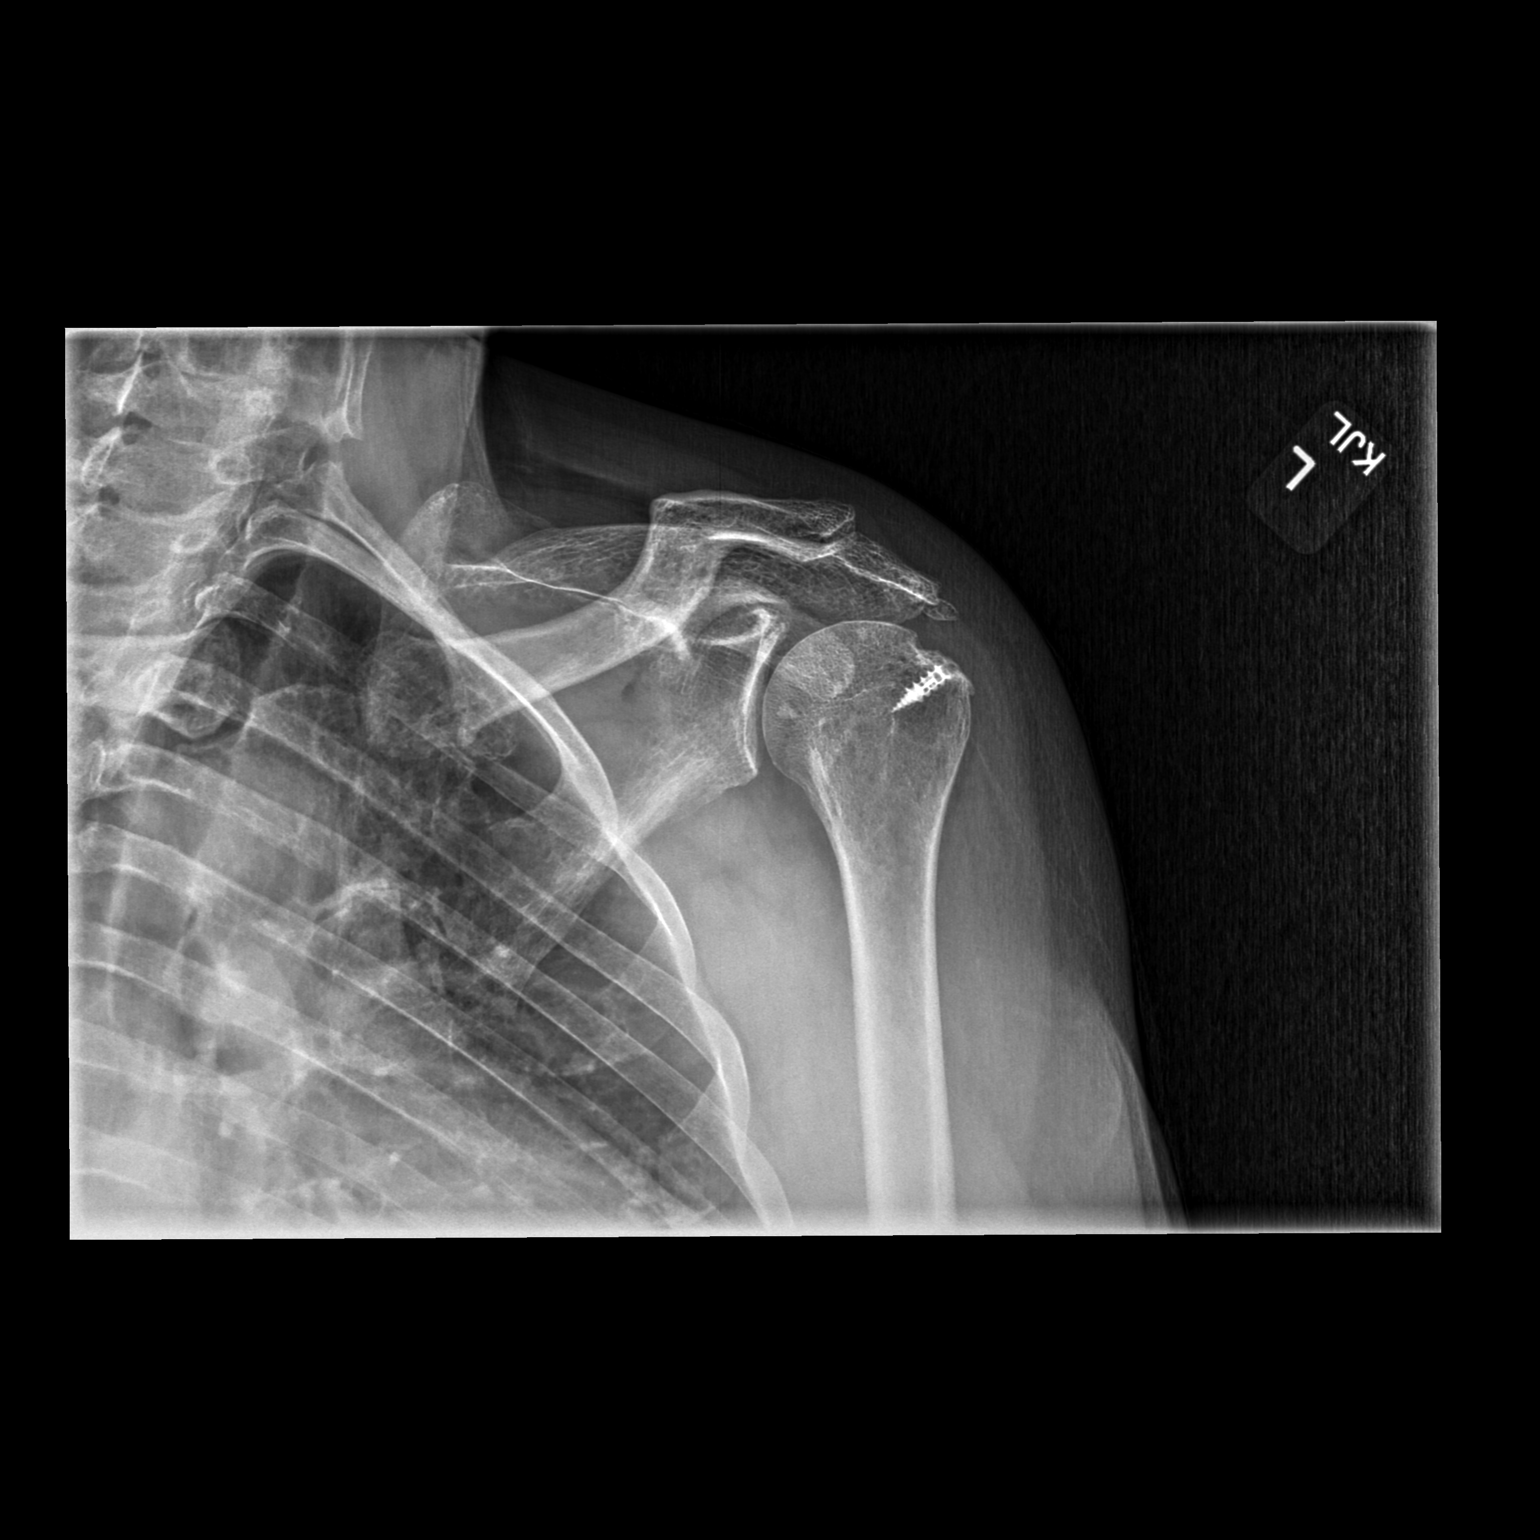

[2 of 2 positions shown; findings below may reference images not displayed]

FINDINGS: Suture anchor is present within the left humeral head. Humeral head is 
well located within the glenoid fossa. No fracture. No subluxation. The acromial 
clavicular and coracoclavicular spaces are preserved. Included portions of the 
hemithoraces are clear.
IMPRESSION: No acute osseous abnormality. If symptoms persist, consideration could be made 
for MR exam.
